# Patient Record
Sex: Female | Born: 1990 | Race: Black or African American | Hispanic: No | Marital: Single | Smoking: Current every day smoker
Health system: Southern US, Community
[De-identification: ages and names within clinical notes are randomized; demographics above are authoritative.]

## PROBLEM LIST (undated history)

## (undated) DIAGNOSIS — S92909A Unspecified fracture of unspecified foot, initial encounter for closed fracture: Secondary | ICD-10-CM

## (undated) DIAGNOSIS — E119 Type 2 diabetes mellitus without complications: Secondary | ICD-10-CM

## (undated) DIAGNOSIS — I10 Essential (primary) hypertension: Secondary | ICD-10-CM

---
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
## 2010-03-25 ENCOUNTER — Emergency Department (HOSPITAL_COMMUNITY): Admission: EM | Admit: 2010-03-25 | Discharge: 2010-03-25 | Payer: Self-pay | Admitting: Emergency Medicine

## 2010-08-23 LAB — RPR: RPR Ser Ql: NONREACTIVE

## 2010-08-23 LAB — WET PREP, GENITAL

## 2010-08-23 LAB — GC/CHLAMYDIA PROBE AMP, GENITAL: GC Probe Amp, Genital: NEGATIVE

## 2010-08-23 LAB — ABO/RH: ABO/RH(D): O POS

## 2010-08-23 LAB — CBC
HCT: 34.7 % — ABNORMAL LOW (ref 36.0–46.0)
Hemoglobin: 11.5 g/dL — ABNORMAL LOW (ref 12.0–15.0)
RBC: 4.23 MIL/uL (ref 3.87–5.11)
WBC: 9.3 10*3/uL (ref 4.0–10.5)

## 2010-08-23 LAB — SICKLE CELL SCREEN: Sickle Cell Screen: NEGATIVE

## 2010-08-23 LAB — HIV ANTIBODY (ROUTINE TESTING W REFLEX): HIV: NONREACTIVE

## 2010-08-23 LAB — RUBELLA SCREEN: Rubella: 76.8 IU/mL — ABNORMAL HIGH

## 2010-08-23 LAB — TYPE AND SCREEN

## 2010-08-26 LAB — CBC
HCT: 35.6 % — ABNORMAL LOW (ref 36.0–46.0)
HCT: 36.2 % (ref 36.0–46.0)
Hemoglobin: 12 g/dL (ref 12.0–15.0)
MCHC: 32.8 g/dL (ref 30.0–36.0)
MCHC: 33.2 g/dL (ref 30.0–36.0)
MCV: 83.2 fL (ref 78.0–100.0)
MCV: 83.2 fL (ref 78.0–100.0)
Platelets: 174 10*3/uL (ref 150–400)
RBC: 4.35 MIL/uL (ref 3.87–5.11)
RDW: 16.5 % — ABNORMAL HIGH (ref 11.5–15.5)
WBC: 11.3 10*3/uL — ABNORMAL HIGH (ref 4.0–10.5)

## 2010-08-31 LAB — CBC
HCT: 35.2 % — ABNORMAL LOW (ref 36.0–46.0)
Platelets: 225 10*3/uL (ref 150–400)
RDW: 15.8 % — ABNORMAL HIGH (ref 11.5–15.5)

## 2010-08-31 LAB — DIFFERENTIAL
Basophils Absolute: 0 10*3/uL (ref 0.0–0.1)
Lymphs Abs: 1.2 10*3/uL (ref 0.7–4.0)
Monocytes Absolute: 1.6 10*3/uL — ABNORMAL HIGH (ref 0.1–1.0)
Monocytes Relative: 17 % — ABNORMAL HIGH (ref 3–12)
Neutrophils Relative %: 69 % (ref 43–77)

## 2010-09-11 LAB — POCT PREGNANCY, URINE: Preg Test, Ur: POSITIVE

## 2011-03-18 LAB — URINE CULTURE: Colony Count: >=100000

## 2011-03-18 LAB — URINALYSIS, ROUTINE W REFLEX MICROSCOPIC
Ketones, ur: NEGATIVE
Nitrite: POSITIVE — AB
pH: 6

## 2011-03-18 LAB — URINE MICROSCOPIC-ADD ON

## 2011-03-18 LAB — POCT PREGNANCY, URINE: Preg Test, Ur: NEGATIVE

## 2014-04-08 ENCOUNTER — Encounter (HOSPITAL_COMMUNITY): Payer: Self-pay | Admitting: Emergency Medicine

## 2014-04-08 ENCOUNTER — Emergency Department (HOSPITAL_COMMUNITY): Payer: Medicaid Other

## 2014-04-08 ENCOUNTER — Emergency Department (HOSPITAL_COMMUNITY)
Admission: EM | Admit: 2014-04-08 | Discharge: 2014-04-08 | Disposition: A | Discharge status: Home / Self Care | Payer: Medicaid Other | Attending: Emergency Medicine | Admitting: Emergency Medicine

## 2014-04-08 DIAGNOSIS — X58XXXA Exposure to other specified factors, initial encounter: Secondary | ICD-10-CM | POA: Insufficient documentation

## 2014-04-08 DIAGNOSIS — I10 Essential (primary) hypertension: Secondary | ICD-10-CM | POA: Diagnosis not present

## 2014-04-08 DIAGNOSIS — Y9301 Activity, walking, marching and hiking: Secondary | ICD-10-CM | POA: Diagnosis not present

## 2014-04-08 DIAGNOSIS — Z8781 Personal history of (healed) traumatic fracture: Secondary | ICD-10-CM | POA: Insufficient documentation

## 2014-04-08 DIAGNOSIS — Y92513 Shop (commercial) as the place of occurrence of the external cause: Secondary | ICD-10-CM | POA: Diagnosis not present

## 2014-04-08 DIAGNOSIS — M25579 Pain in unspecified ankle and joints of unspecified foot: Secondary | ICD-10-CM

## 2014-04-08 DIAGNOSIS — Z720 Tobacco use: Secondary | ICD-10-CM | POA: Diagnosis not present

## 2014-04-08 DIAGNOSIS — S99911A Unspecified injury of right ankle, initial encounter: Secondary | ICD-10-CM | POA: Insufficient documentation

## 2014-04-08 HISTORY — DX: Essential (primary) hypertension: I10

## 2014-04-08 HISTORY — DX: Unspecified fracture of unspecified foot, initial encounter for closed fracture: S92.909A

## 2014-04-08 MED ORDER — HYDROCODONE-ACETAMINOPHEN 5-325 MG PO TABS
1.0000 | ORAL_TABLET | Freq: Once | ORAL | Status: AC
Start: 2014-04-08 — End: 2014-04-08
  Administered 2014-04-08: 1 via ORAL
  Filled 2014-04-08: qty 1

## 2014-04-08 MED ORDER — HYDROCODONE-ACETAMINOPHEN 5-325 MG PO TABS: 1.0000 | ORAL_TABLET | Freq: Four times a day (QID) | ORAL | Status: DC | PRN
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
## 2014-04-08 NOTE — Discharge Instructions (Signed)
Ankle Pain Ankle pain is a common symptom. The bones, cartilage, tendons, and muscles of the ankle joint perform a lot of work each day. The ankle joint holds your body weight and allows you to move around. Ankle pain can occur on either side or back of 1 or both ankles. Ankle pain may be sharp and burning or dull and aching. There may be tenderness, stiffness, redness, or warmth around the ankle. The pain occurs more often when a person walks or puts pressure on the ankle. CAUSES  There are many reasons ankle pain can develop. It is important to work with your caregiver to identify the cause since many conditions can impact the bones, cartilage, muscles, and tendons. Causes for ankle pain include:  Injury, including a break (fracture), sprain, or strain often due to a fall, sports, or a high-impact activity.  Swelling (inflammation) of a tendon (tendonitis).  Achilles tendon rupture.  Ankle instability after repeated sprains and strains.  Poor foot alignment.  Pressure on a nerve (tarsal tunnel syndrome).  Arthritis in the ankle or the lining of the ankle.  Crystal formation in the ankle (gout or pseudogout). DIAGNOSIS  A diagnosis is based on your medical history, your symptoms, results of your physical exam, and results of diagnostic tests. Diagnostic tests may include X-ray exams or a computerized magnetic scan (magnetic resonance imaging, MRI). TREATMENT  Treatment will depend on the cause of your ankle pain and may include:  Keeping pressure off the ankle and limiting activities.  Using crutches or other walking support (a cane or brace).  Using rest, ice, compression, and elevation.  Participating in physical therapy or home exercises.  Wearing shoe inserts or special shoes.  Losing weight.  Taking medications to reduce pain or swelling or receiving an injection.  Undergoing surgery. HOME CARE INSTRUCTIONS   Only take over-the-counter or prescription medicines for  pain, discomfort, or fever as directed by your caregiver.  Put ice on the injured area.  Put ice in a plastic bag.  Place a towel between your skin and the bag.  Leave the ice on for 15-20 minutes at a time, 03-04 times a day.  Keep your leg raised (elevated) when possible to lessen swelling.  Avoid activities that cause ankle pain.  Follow specific exercises as directed by your caregiver.  Record how often you have ankle pain, the location of the pain, and what it feels like. This information may be helpful to you and your caregiver.  Ask your caregiver about returning to work or sports and whether you should drive.  Follow up with your caregiver for further examination, therapy, or testing as directed. SEEK MEDICAL CARE IF:   Pain or swelling continues or worsens beyond 1 week.  You have an oral temperature above 102 F (38.9 C).  You are feeling unwell or have chills.  You are having an increasingly difficult time with walking.  You have loss of sensation or other new symptoms.  You have questions or concerns. MAKE SURE YOU:   Understand these instructions.  Will watch your condition.  Will get help right away if you are not doing well or get worse. Document Released: 11/11/2009 Document Revised: 08/16/2011 Document Reviewed: 11/11/2009 [facility] Patient Information 2015 [place], [place]. This information is not intended to replace advice given to you by your health care provider. Make sure you discuss any questions you have with your health care provider. wear the ACE wrap for comfort for the next several days

## 2014-07-22 ENCOUNTER — Emergency Department (HOSPITAL_COMMUNITY)
Admission: EM | Admit: 2014-07-22 | Discharge: 2014-07-23 | Disposition: A | Discharge status: Home / Self Care | Payer: Medicaid Other | Attending: Emergency Medicine | Admitting: Emergency Medicine

## 2014-07-22 ENCOUNTER — Encounter (HOSPITAL_COMMUNITY): Payer: Self-pay

## 2014-07-22 ENCOUNTER — Emergency Department (HOSPITAL_COMMUNITY): Payer: Medicaid Other

## 2014-07-22 DIAGNOSIS — Z720 Tobacco use: Secondary | ICD-10-CM | POA: Diagnosis not present

## 2014-07-22 DIAGNOSIS — I10 Essential (primary) hypertension: Secondary | ICD-10-CM | POA: Diagnosis not present

## 2014-07-22 DIAGNOSIS — M940 Chondrocostal junction syndrome [Tietze]: Secondary | ICD-10-CM | POA: Diagnosis not present

## 2014-07-22 DIAGNOSIS — J069 Acute upper respiratory infection, unspecified: Secondary | ICD-10-CM | POA: Insufficient documentation

## 2014-07-22 DIAGNOSIS — Z8781 Personal history of (healed) traumatic fracture: Secondary | ICD-10-CM | POA: Diagnosis not present

## 2014-07-22 DIAGNOSIS — R05 Cough: Secondary | ICD-10-CM | POA: Diagnosis present

## 2014-07-22 DIAGNOSIS — R000 Tachycardia, unspecified: Secondary | ICD-10-CM | POA: Diagnosis not present

## 2014-07-22 DIAGNOSIS — B9789 Other viral agents as the cause of diseases classified elsewhere: Secondary | ICD-10-CM

## 2014-07-22 NOTE — ED Notes (Signed)
Pt presents with c/o back pain in the middle of her back and says that her chest hurts when she coughs. Pt reports the cough started 2 days ago.

## 2014-07-22 NOTE — ED Provider Notes (Signed)
CSN: [id number]     Arrival date & time 07/22/14  2322 History  This chart was scribed for non-physician practitioner, Antony MaduraKelly Octa Uplinger, PA-C working with Tomasita CrumbleAdeleke Oni, MD by Freida Busmaniana Omoyeni, ED Scribe. This patient was seen in room WTR7/WTR7 and the patient's care was started at 11:37 PM.   Chief Complaint  Patient presents with  . Back Pain  . Cough    The history is provided by the patient. No language interpreter was used.    HPI Comments:  Wendy Waters is a 24 y.o. female who presents to the Emergency Department complaining of persistent dry cough and congestion for 3 days. She reports associated intermittent, sharp, stabbing chest and back pain and mild SOB that started this evening. Symptoms worsened when supine. No modifying factors noted. Patient reports that she did take a shot of liquor to try and alleviate symptoms, without relief. She denies fever, vomiting, syncope, and hemoptysis. She also denies recent hospitalization/surgeries, use of birth control pills, and recent sick contacts.   Past Medical History  Diagnosis Date  . Hypertension   . Fracture of foot     right   Past Surgical History  Procedure Laterality Date  . Cesarean section     No family history on file. History  Substance Use Topics  . Smoking status: Current Every Day Smoker -- 0.10 packs/day    Types: Cigarettes  . Smokeless tobacco: Not on file  . Alcohol Use: Yes     Comment: occ   OB History    No data available      Review of Systems  Constitutional: Negative for fever.  HENT: Positive for congestion.   Respiratory: Positive for cough and shortness of breath.   Cardiovascular: Positive for chest pain.  Musculoskeletal: Positive for back pain.  All other systems reviewed and are negative.   Allergies  Aleve  Home Medications   Prior to Admission medications   Medication Sig Start Date End Date Taking? Authorizing Provider  HYDROcodone-acetaminophen (NORCO/VICODIN) 5-325 MG per tablet  Take 1 tablet by mouth every 6 (six) hours as needed for moderate pain. 04/08/14   Arman FilterGail K Schulz, NP   BP 135/68 mmHg  Pulse 116  Temp(Src) 99.5 F (37.5 C) (Oral)  Resp 20  Ht 5\' 6"  (1.676 m)  Wt 150 lb (68.04 kg)  BMI 24.22 kg/m2  SpO2 98%  LMP 06/21/2014   Physical Exam  Constitutional: She is oriented to person, place, and time. She appears well-developed and well-nourished. No distress.  Nontoxic/nonseptic appearing  HENT:  Head: Normocephalic and atraumatic.  Right Ear: External ear and ear canal normal.  Left Ear: Tympanic membrane, external ear and ear canal normal.  Mouth/Throat: Uvula is midline, oropharynx is clear and moist and mucous membranes are normal.  Audible nasal congestion. Oropharynx clear and uvula midline. Patient tolerating secretions without difficulty. Right TM obscured by cerumen.  Eyes: Conjunctivae and EOM are normal. Pupils are equal, round, and reactive to light. No scleral icterus.  Neck: Normal range of motion.  Cardiovascular: Regular rhythm and intact distal pulses.  Tachycardia present.   Pulmonary/Chest: Effort normal. No respiratory distress. She has no wheezes. She has no rales.  Respirations even and unlabored. Lungs CTAB.  Musculoskeletal: Normal range of motion.  Neurological: She is alert and oriented to person, place, and time. She exhibits normal muscle tone. Coordination normal.  GCS 15. Patient moves extremities without ataxia.  Skin: Skin is warm and dry. No rash noted. She is not diaphoretic. No  erythema. No pallor.  Psychiatric: She has a normal mood and affect. Her behavior is normal.  Nursing note and vitals reviewed.   ED Course  Procedures   DIAGNOSTIC STUDIES:  Oxygen Saturation is 98% on RA, normal by my interpretation.    COORDINATION OF CARE:  11:41 PM Will order XR. Discussed treatment plan with pt at bedside and pt agreed to plan.  Labs Review Labs Reviewed  D-DIMER, QUANTITATIVE   Imaging Review Dg Chest 2  View  07/22/2014   CLINICAL DATA:  Nonproductive cough. Midchest pain and mid thoracic pain  EXAM: CHEST  2 VIEW  COMPARISON:  08/12/2009  FINDINGS: The heart size and mediastinal contours are within normal limits. Both lungs are clear. The visualized skeletal structures are unremarkable.  IMPRESSION: No acute findings   Electronically Signed   By: Ellery Plunk M.D.   On: 07/22/2014 23:54     EKG Interpretation None      MDM   Final diagnoses:  Viral URI with cough  Costochondritis    Patient with CXR negative for acute infiltrate. Patients symptoms are consistent with URI, likely viral etiology. Pulmonary embolus considered given tachycardia on arrival; this is unlikely given associated URI symptoms, low risk for DVT/PE, ability to ambulate in ED without hypoxia, and negative D dimer on work up. Discussed that antibiotics are not indicated for viral infections. Patient will be discharged with symptomatic treatment. Return precautions given. Patient agreeable to plan with no unaddressed concerns.  I personally performed the services described in this documentation, which was scribed in my presence. The recorded information has been reviewed and is accurate.   Filed Vitals:   07/22/14 2330 07/23/14 0050  BP: 135/68 136/74  Pulse: 116 95  Temp: 99.5 F (37.5 C) 99 F (37.2 C)  TempSrc: Oral Oral  Resp: 20 20  Height:  (1.676 m)   Weight: 150 lb (68.04 kg)   SpO2: 98% 97%      Antony Madura, PA-C 07/23/14 5409  Tomasita Crumble, MD 07/23/14 1545

## 2014-07-23 LAB — D-DIMER, QUANTITATIVE: D-Dimer, Quant: 0.46 ug/mL-FEU (ref 0.00–0.48)

## 2014-07-23 MED ORDER — BENZONATATE 100 MG PO CAPS: 100.0000 mg | ORAL_CAPSULE | Freq: Three times a day (TID) | ORAL | Status: DC

## 2014-07-23 MED ORDER — PREDNISONE 20 MG PO TABS: 40.0000 mg | ORAL_TABLET | Freq: Every day | ORAL | Status: DC

## 2014-07-23 MED ORDER — SALINE SPRAY 0.65 % NA SOLN: 1.0000 | NASAL | Status: DC | PRN

## 2014-07-23 MED ORDER — ALBUTEROL SULFATE HFA 108 (90 BASE) MCG/ACT IN AERS
2.0000 | INHALATION_SPRAY | Freq: Once | RESPIRATORY_TRACT | Status: AC
  Administered 2014-07-23: 2 via RESPIRATORY_TRACT
  Filled 2014-07-23: qty 6.7

## 2014-07-23 NOTE — Discharge Instructions (Signed)
Take prednisone as prescribed. You may also use an albuterol inhaler, 2 puffs every 4 hours, as needed for shortness of breath and cough. Take Tessalon as prescribed for cough. You may also use Ocean nasal spray for congestion. Follow-up with a primary care doctor in 1 week for recheck of symptoms. Return to the emergency department as needed if symptoms worsen.  Costochondritis Costochondritis, sometimes called Tietze syndrome, is a swelling and irritation (inflammation) of the tissue (cartilage) that connects your ribs with your breastbone (sternum). It causes pain in the chest and rib area. Costochondritis usually goes away on its own over time. It can take up to 6 weeks or longer to get better, especially if you are unable to limit your activities. CAUSES  Some cases of costochondritis have no known cause. Possible causes include:  Injury (trauma).  Exercise or activity such as lifting.  Severe coughing. SIGNS AND SYMPTOMS  Pain and tenderness in the chest and rib area.  Pain that gets worse when coughing or taking deep breaths.  Pain that gets worse with specific movements. DIAGNOSIS  Your health care provider will do a physical exam and ask about your symptoms. Chest X-rays or other tests may be done to rule out other problems. TREATMENT  Costochondritis usually goes away on its own over time. Your health care provider may prescribe medicine to help relieve pain. HOME CARE INSTRUCTIONS   Avoid exhausting physical activity. Try not to strain your ribs during normal activity. This would include any activities using chest, abdominal, and side muscles, especially if heavy weights are used.  Apply ice to the affected area for the first 2 days after the pain begins.  Put ice in a plastic bag.  Place a towel between your skin and the bag.  Leave the ice on for 20 minutes, 2-3 times a day.  Only take over-the-counter or prescription medicines as directed by your health care  provider. SEEK MEDICAL CARE IF:  You have redness or swelling at the rib joints. These are signs of infection.  Your pain does not go away despite rest or medicine. SEEK IMMEDIATE MEDICAL CARE IF:   Your pain increases or you are very uncomfortable.  You have shortness of breath or difficulty breathing.  You cough up blood.  You have worse chest pains, sweating, or vomiting.  You have a fever or persistent symptoms for more than 2-3 days.  You have a fever and your symptoms suddenly get worse. MAKE SURE YOU:   Understand these instructions.  Will watch your condition.  Will get help right away if you are not doing well or get worse. Document Released: 03/03/2005 Document Revised: 03/14/2013 Document Reviewed: 12/26/2012 [facility] Patient Information 2015 [place], [place]. This information is not intended to replace advice given to you by your health care provider. Make sure you discuss any questions you have with your health care provider. Upper Respiratory Infection, Adult An upper respiratory infection (URI) is also sometimes known as the common cold. The upper respiratory tract includes the nose, sinuses, throat, trachea, and bronchi. Bronchi are the airways leading to the lungs. Most people improve within 1 week, but symptoms can last up to 2 weeks. A residual cough may last even longer.  CAUSES Many different viruses can infect the tissues lining the upper respiratory tract. The tissues become irritated and inflamed and often become very moist. Mucus production is also common. A cold is contagious. You can easily spread the virus to others by oral contact. This includes kissing,  sharing a glass, coughing, or sneezing. Touching your mouth or nose and then touching a surface, which is then touched by another person, can also spread the virus. SYMPTOMS  Symptoms typically develop 1 to 3 days after you come in contact with a cold virus. Symptoms vary from person to person. They may  include:  Runny nose.  Sneezing.  Nasal congestion.  Sinus irritation.  Sore throat.  Loss of voice (laryngitis).  Cough.  Fatigue.  Muscle aches.  Loss of appetite.  Headache.  Low-grade fever. DIAGNOSIS  You might diagnose your own cold based on familiar symptoms, since most people get a cold 2 to 3 times a year. Your caregiver can confirm this based on your exam. Most importantly, your caregiver can check that your symptoms are not due to another disease such as strep throat, sinusitis, pneumonia, asthma, or epiglottitis. Blood tests, throat tests, and X-rays are not necessary to diagnose a common cold, but they may sometimes be helpful in excluding other more serious diseases. Your caregiver will decide if any further tests are required. RISKS AND COMPLICATIONS  You may be at risk for a more severe case of the common cold if you smoke cigarettes, have chronic heart disease (such as heart failure) or lung disease (such as asthma), or if you have a weakened immune system. The very young and very old are also at risk for more serious infections. Bacterial sinusitis, middle ear infections, and bacterial pneumonia can complicate the common cold. The common cold can worsen asthma and chronic obstructive pulmonary disease (COPD). Sometimes, these complications can require emergency medical care and may be life-threatening. PREVENTION  The best way to protect against getting a cold is to practice good hygiene. Avoid oral or hand contact with people with cold symptoms. Wash your hands often if contact occurs. There is no clear evidence that vitamin C, vitamin E, echinacea, or exercise reduces the chance of developing a cold. However, it is always recommended to get plenty of rest and practice good nutrition. TREATMENT  Treatment is directed at relieving symptoms. There is no cure. Antibiotics are not effective, because the infection is caused by a virus, not by bacteria. Treatment may  include:  Increased fluid intake. Sports drinks offer valuable electrolytes, sugars, and fluids.  Breathing heated mist or steam (vaporizer or shower).  Eating chicken soup or other clear broths, and maintaining good nutrition.  Getting plenty of rest.  Using gargles or lozenges for comfort.  Controlling fevers with ibuprofen or acetaminophen as directed by your caregiver.  Increasing usage of your inhaler if you have asthma. Zinc gel and zinc lozenges, taken in the first 24 hours of the common cold, can shorten the duration and lessen the severity of symptoms. Pain medicines may help with fever, muscle aches, and throat pain. A variety of non-prescription medicines are available to treat congestion and runny nose. Your caregiver can make recommendations and may suggest nasal or lung inhalers for other symptoms.  HOME CARE INSTRUCTIONS   Only take over-the-counter or prescription medicines for pain, discomfort, or fever as directed by your caregiver.  Use a warm mist humidifier or inhale steam from a shower to increase air moisture. This may keep secretions moist and make it easier to breathe.  Drink enough water and fluids to keep your urine clear or pale yellow.  Rest as needed.  Return to work when your temperature has returned to normal or as your caregiver advises. You may need to stay home longer to avoid  infecting others. You can also use a face mask and careful hand washing to prevent spread of the virus. SEEK MEDICAL CARE IF:   After the first few days, you feel you are getting worse rather than better.  You need your caregiver's advice about medicines to control symptoms.  You develop chills, worsening shortness of breath, or brown or red sputum. These may be signs of pneumonia.  You develop yellow or brown nasal discharge or pain in the face, especially when you bend forward. These may be signs of sinusitis.  You develop a fever, swollen neck glands, pain with  swallowing, or white areas in the back of your throat. These may be signs of strep throat. SEEK IMMEDIATE MEDICAL CARE IF:   You have a fever.  You develop severe or persistent headache, ear pain, sinus pain, or chest pain.  You develop wheezing, a prolonged cough, cough up blood, or have a change in your usual mucus (if you have chronic lung disease).  You develop sore muscles or a stiff neck. Document Released: 11/17/2000 Document Revised: 08/16/2011 Document Reviewed: 08/29/2013 [facility] Patient Information 2015 [place], [place]. This information is not intended to replace advice given to you by your health care provider. Make sure you discuss any questions you have with your health care provider. Cough, Adult  A cough is a reflex that helps clear your throat and airways. It can help heal the body or may be a reaction to an irritated airway. A cough may only last 2 or 3 weeks (acute) or may last more than 8 weeks (chronic).  CAUSES Acute cough:  Viral or bacterial infections. Chronic cough:  Infections.  Allergies.  Asthma.  Post-nasal drip.  Smoking.  Heartburn or acid reflux.  Some medicines.  Chronic lung problems (COPD).  Cancer. SYMPTOMS   Cough.  Fever.  Chest pain.  Increased breathing rate.  High-pitched whistling sound when breathing (wheezing).  Colored mucus that you cough up (sputum). TREATMENT   A bacterial cough may be treated with antibiotic medicine.  A viral cough must run its course and will not respond to antibiotics.  Your caregiver may recommend other treatments if you have a chronic cough. HOME CARE INSTRUCTIONS   Only take over-the-counter or prescription medicines for pain, discomfort, or fever as directed by your caregiver. Use cough suppressants only as directed by your caregiver.  Use a cold steam vaporizer or humidifier in your bedroom or home to help loosen secretions.  Sleep in a semi-upright position if your cough is  worse at night.  Rest as needed.  Stop smoking if you smoke. SEEK IMMEDIATE MEDICAL CARE IF:   You have pus in your sputum.  Your cough starts to worsen.  You cannot control your cough with suppressants and are losing sleep.  You begin coughing up blood.  You have difficulty breathing.  You develop pain which is getting worse or is uncontrolled with medicine.  You have a fever. MAKE SURE YOU:   Understand these instructions.  Will watch your condition.  Will get help right away if you are not doing well or get worse. Document Released: 11/20/2010 Document Revised: 08/16/2011 Document Reviewed: 11/20/2010 [facility] Patient Information 2015 [place], [place]. This information is not intended to replace advice given to you by your health care provider. Make sure you discuss any questions you have with your health care provider.   Emergency Department Resource Guide 1) Find a Doctor and Pay Out of Pocket Although you won't have to find out  who is covered by your insurance plan, it is a good idea to ask around and get recommendations. You will then need to call the office and see if the doctor you have chosen will accept you as a new patient and what types of options they offer for patients who are self-pay. Some doctors offer discounts or will set up payment plans for their patients who do not have insurance, but you will need to ask so you aren't surprised when you get to your appointment.  2) Contact Your Local Health Department Not all health departments have doctors that can see patients for sick visits, but many do, so it is worth a call to see if yours does. If you don't know where your local health department is, you can check in your phone book. The CDC also has a tool to help you locate your state's health department, and many state websites also have listings of all of their local health departments.  3) Find a Walk-in Clinic If your illness is not likely to be very severe  or complicated, you may want to try a walk in clinic. These are popping up all over the country in pharmacies, drugstores, and shopping centers. They're usually staffed by nurse practitioners or physician assistants that have been trained to treat common illnesses and complaints. They're usually fairly quick and inexpensive. However, if you have serious medical issues or chronic medical problems, these are probably not your best option.  No Primary Care Doctor: - Call Health Connect at  [phone] - they can help you locate a primary care doctor that  accepts your insurance, provides certain services, etc. - Physician Referral Service- [phone]  Chronic Pain Problems: Organization         Address  Phone   Notes  [facility]  [phone] Patients need to be referred by their primary care doctor.   Medication Assistance: Organization         Address  Phone   Notes  [facility] [address] [place] [place], [place] [address] [phone] --Must be a resident of [facility] -- Must have NO insurance coverage whatsoever (no Medicaid/ Medicare, etc.) -- The pt. MUST have a primary care doctor that directs their care regularly and follows them in the community   [facility]  [phone]   [employer]  [phone]    Agencies that provide inexpensive medical care: Organization         Address  Phone   Notes  [facility]  [phone]   [facility]    [phone]   [facility] [address] [place], [place] [address] [phone]   [facility] [address] [place], [place] [phone]   Planned Parenthood    [phone]   [facility]    [phone]   Community Health and [facility]  [address], [place] Phone:  [phone], Fax:  [phone] Hours of Operation:  9 am - 6 pm, M-F.  Also accepts  Medicaid/Medicare and self-pay.  [facility]  [address], [place] Phone: [phone], Fax: [phone]. Hours of Operation:  8:30 am - 5:30 pm, M-F.  Also accepts Medicaid and self-pay.  [facility] [place] [address] [employer] Phone: [phone]   [facility] [address] [place], [place] (  [phone], Ext. [phone] Mondays & Thursdays: 7-9 AM.  First 15 patients are seen on a first come, first serve basis.    Medicaid-accepting [facility] Providers:  Organization         Address  Phone   Notes  [facility] [address], [place] [phone] Also accepts self-pay patients.  [facility] [address], [place]  [phone]   [facility] [address], [place] [phone]   [facility] [address] [place] [address], [place] [phone]) [phone]   Renaye RakersVeita Bland [address], [place]   [phone]) [phone] Only accepts [place] Access [place] patients after they have their name applied to their card.   Self-Pay (no insurance) in [facility] [place]:  Organization         Address  Phone   Notes  Sickle Cell Patients, [facility] [address], [place] [phone]   [facility] [address], [place] [phone](336) [phone]   [facility]  [address], [place] [phone]   [facility]/Dr. Osei-Bonsu  [address] [place] [address], [place] or [phone], [place] [phone] Phone number for both [place] and [place] locations is the same.  [facility] [address], [place] [phone]   [facility] [address] [place] [address], [place] or [address] [phone]   [facility] [address], [place] [phone])  [phone], phone; [phone], fax Sees patients 1st and 3rd Saturday of every month.  Must not qualify for public or private insurance (i.e. Medicaid, Medicare, [place] Health Choice, Veterans' Benefits)  Household income should be no more than 200% of the poverty level The clinic cannot treat you if you are pregnant or think you are pregnant  Sexually transmitted diseases are not treated at the clinic.    Dental Care: Organization         Address  Phone  Notes  [facility] [address], [place] [phone] Accepts children up to age 24 who are enrolled in [place] or [place] Health Choice; pregnant women with a Medicaid card; and children who have applied for Medicaid or [place] Health Choice, but were declined, whose parents can pay a reduced fee at time of service.  [facility] [place]  [address], [place] [phone] Accepts children up to age 24 who are enrolled in [place] or [place] Health Choice; pregnant women with a Medicaid card; and children who have applied for Medicaid or [place] Health Choice, but were declined, whose parents can pay a reduced fee at time of service.  [facility]  [address], [place] [phone]) [phone] Patients are seen by appointment only. Walk-ins are not accepted. [facility] will see patients 24 years of age and older. Monday - Tuesday (8am-5pm) Most Wednesdays (8:30-5pm) $30 per visit, cash only  [facility]  [address], [facility] [phone]) [phone] Patients are seen by appointment only. Walk-ins are not accepted. [facility] will see patients 24 years of age and older. One Wednesday Evening (Monthly: Volunteer Based).  $30 per visit, cash only  [employer] [facility] [facility]  [phone]  for adults; Children under age 63, call Graduate Pediatric Dentistry at [phone]. Children aged  64-14, please call [phone] to request a pediatric application.  Dental services are provided in all areas of dental care including fillings, crowns and bridges, complete and partial dentures, implants, gum treatment, root canals, and extractions. Preventive care is also provided. Treatment is provided to both adults and children. Patients are selected via a lottery and there is often a waiting list.   [facility] [address] [place]  [phone] [email or web address]   [facility] [address] [place], [place] [phone], Ext. [phone] Second and Fourth Thursday of each month, opens at 6:30 AM; Clinic ends at 9 AM.  Patients are seen on a first-come first-served basis, and a limited number are seen during each clinic.   [facility]  [address] [place], [place] [phone]   Eligibility Requirements You must have lived in [place], [place], or [place] counties for at least the last three months.   You cannot be eligible for state or federal sponsored [place], including [employer], [place], or [employer].   You generally cannot be eligible for healthcare insurance through your employer.    How to apply: Eligibility screenings are held every Tuesday and Wednesday afternoon from 1:00 pm until 4:00 pm. You do not need an appointment for the interview!  [facility] [address] [place], [place] [phone]   [facility] Health Department  [phone]   [facility] Health Department  [phone]   [facility] Health Department  [phone]    Behavioral Health Resources in the Community: Intensive Outpatient Programs Organization         Address  Phone  Notes  [facility] [address] [place], [place] [phone]   [facility] [address] [place], [place] [phone]   [facility] [address]  [place], [place]  [phone]   [facility] [address],  [place], [place] [phone] or [phone]   Substance Abuse Resources Organization         Address  Phone  Notes  [facility]  [phone]   [facility]  [phone]   [facility] [place]  [phone]   Floydene Flock  [phone]   Residential & Outpatient Substance Abuse Program  [phone]   Psychological Services Organization         Address  Phone  Notes  [facility]  [phone]   [facility]  [phone]   [facility] [address], [place] [phone] or [phone]    Mobile Crisis Teams Organization         Address  Phone  Notes  [facility], Mobile Crisis Care Unit  [phone]   [facility]  [address] [place], [place] [phone]   Doristine Locks [address] [place] [phone]    Self-Help/Support Groups Organization         Address  Phone             Notes  [facility] [place] - variety of support groups  [phone]- I7437963 Call for more information  Narcotics Anonymous (NA), [facility] [address], [employer] [place]  2 meetings at this location   Chief Executive Officer  Notes  [facility] [address]    [place]  [phone]   [facility]  [address], [place] [address], [place], [place] [phone]   [facility] [address] [place], [place] [phone] Admissions: 8am-3pm M-F  [facility] [address]    [place], [place] [phone]   [facility] [address] [place], [place], [place] [phone]   [facility] [address],  [place], [place] [phone]   [facility] - Intensive Outpatient [address], [place], [place] [phone]   [facility]  (Addiction [facility] [address]  [place], [place] [phone] or [phone]   [facility] ([facility]) [address], [place], [place] [phone] Accepts Medicaid  [facility] [address]  [place] [phone] Substance Abuse/Addiction Treatment   [facility] Organization         Address  Phone  Notes  [facility]  [phone]   Angie Fava, PhD [address] [place], [place]   [phone] or [phone]   [facility] Behavioral   [address] [place], [place] [phone]   [facility] [address], [place], [place] [phone] Insurance/Medicaid/sponsorship through [facility] and Families [address]                                    [place], [place] [phone] Therapy/tele-psych/case  [facility] [address], [place] [id number]    Dr. Lolly Mustache  [phone]   [facility]  [employer] [facility] Dept. 1) [address], [place] 2) [address] [place] 3)  [address], [place] [phone]  [phone]   [place] Child Abuse Hotline [phone] or [phone] (After Hours)

## 2014-07-23 NOTE — ED Notes (Signed)
Ambulated patient in the hallway. Pt's O2 sat's were 97%-98%, HR 118.

## 2014-10-15 ENCOUNTER — Emergency Department (HOSPITAL_COMMUNITY)
Admission: EM | Admit: 2014-10-15 | Discharge: 2014-10-15 | Disposition: A | Discharge status: Home / Self Care | Payer: Medicaid Other | Attending: Emergency Medicine | Admitting: Emergency Medicine

## 2014-10-15 ENCOUNTER — Encounter (HOSPITAL_COMMUNITY): Payer: Self-pay

## 2014-10-15 DIAGNOSIS — Z9889 Other specified postprocedural states: Secondary | ICD-10-CM | POA: Diagnosis not present

## 2014-10-15 DIAGNOSIS — N760 Acute vaginitis: Secondary | ICD-10-CM | POA: Diagnosis not present

## 2014-10-15 DIAGNOSIS — Z8781 Personal history of (healed) traumatic fracture: Secondary | ICD-10-CM | POA: Diagnosis not present

## 2014-10-15 DIAGNOSIS — N898 Other specified noninflammatory disorders of vagina: Secondary | ICD-10-CM | POA: Diagnosis present

## 2014-10-15 DIAGNOSIS — Z3202 Encounter for pregnancy test, result negative: Secondary | ICD-10-CM | POA: Diagnosis not present

## 2014-10-15 DIAGNOSIS — Z720 Tobacco use: Secondary | ICD-10-CM | POA: Diagnosis not present

## 2014-10-15 DIAGNOSIS — B9689 Other specified bacterial agents as the cause of diseases classified elsewhere: Secondary | ICD-10-CM

## 2014-10-15 LAB — URINE MICROSCOPIC-ADD ON

## 2014-10-15 LAB — WET PREP, GENITAL
Trich, Wet Prep: NONE SEEN
Yeast Wet Prep HPF POC: NONE SEEN

## 2014-10-15 LAB — URINALYSIS, ROUTINE W REFLEX MICROSCOPIC
Bilirubin Urine: NEGATIVE
GLUCOSE, UA: NEGATIVE mg/dL
KETONES UR: NEGATIVE mg/dL
Nitrite: NEGATIVE
PH: 5.5 (ref 5.0–8.0)
Protein, ur: 100 mg/dL — AB
Specific Gravity, Urine: 1.021 (ref 1.005–1.030)
Urobilinogen, UA: 1 mg/dL (ref 0.0–1.0)

## 2014-10-15 LAB — PREGNANCY, URINE: Preg Test, Ur: NEGATIVE

## 2014-10-15 MED ORDER — METRONIDAZOLE 500 MG PO TABS: 500.0000 mg | ORAL_TABLET | Freq: Two times a day (BID) | ORAL | Status: DC

## 2014-10-15 NOTE — Discharge Instructions (Signed)
Bacterial Vaginosis Bacterial vaginosis is a vaginal infection that occurs when the normal balance of bacteria in the vagina is disrupted. It results from an overgrowth of certain bacteria. This is the most common vaginal infection in women of childbearing age. Treatment is important to prevent complications, especially in pregnant women, as it can cause a premature delivery. CAUSES  Bacterial vaginosis is caused by an increase in harmful bacteria that are normally present in smaller amounts in the vagina. Several different kinds of bacteria can cause bacterial vaginosis. However, the reason that the condition develops is not fully understood. RISK FACTORS Certain activities or behaviors can put you at an increased risk of developing bacterial vaginosis, including:  Having a new sex partner or multiple sex partners.  Douching.  Using an intrauterine device (IUD) for contraception. Women do not get bacterial vaginosis from toilet seats, bedding, swimming pools, or contact with objects around them. SIGNS AND SYMPTOMS  Some women with bacterial vaginosis have no signs or symptoms. Common symptoms include:  Grey vaginal discharge.  A fishlike odor with discharge, especially after sexual intercourse.  Itching or burning of the vagina and vulva.  Burning or pain with urination. DIAGNOSIS  Your health care provider will take a medical history and examine the vagina for signs of bacterial vaginosis. A sample of vaginal fluid may be taken. Your health care provider will look at this sample under a microscope to check for bacteria and abnormal cells. A vaginal pH test may also be done.  TREATMENT  Bacterial vaginosis may be treated with antibiotic medicines. These may be given in the form of a pill or a vaginal cream. A second round of antibiotics may be prescribed if the condition comes back after treatment.  HOME CARE INSTRUCTIONS   Only take over-the-counter or prescription medicines as  directed by your health care provider.  If antibiotic medicine was prescribed, take it as directed. Make sure you finish it even if you start to feel better.  Do not have sex until treatment is completed.  Tell all sexual partners that you have a vaginal infection. They should see their health care provider and be treated if they have problems, such as a mild rash or itching.  Practice safe sex by using condoms and only having one sex partner. SEEK MEDICAL CARE IF:   Your symptoms are not improving after 3 days of treatment.  You have increased discharge or pain.  You have a fever. MAKE SURE YOU:   Understand these instructions.  Will watch your condition.  Will get help right away if you are not doing well or get worse. FOR MORE INFORMATION  Centers for Disease Control and Prevention, Division of STD Prevention: www.cdc.gov/std American Sexual Health Association (ASHA): www.ashastd.org  Document Released: 05/24/2005 Document Revised: 03/14/2013 Document Reviewed: 01/03/2013 ExitCare Patient Information 2015 [employer]. This information is not intended to replace advice given to you by your health care provider. Make sure you discuss any questions you have with your health care provider.  

## 2014-10-15 NOTE — ED Notes (Signed)
Pt complains of vaginal itching and discharge since Sunday, she used a different soap over the weekend

## 2014-10-15 NOTE — ED Provider Notes (Signed)
CSN: [id number]     Arrival date & time 10/15/14  2051 History   First MD Initiated Contact with Patient 10/15/14 2214     Chief Complaint  Patient presents with  . Vaginal Discharge     (Consider location/radiation/quality/duration/timing/severity/associated sxs/prior Treatment) HPI Comments: Patient here complaining of vaginal itching and discharge 2 days. Patient also notes a foul smell as well as well 2. Denies any dysuria or hematuria. Did use a new soap this weekend and S1 symptoms started. No flank pain. Symptoms persistent and nothing makes them better. Denies any abdominal pain. No treatment use prior to arrival.  Patient is a 24 y.o. female presenting with vaginal discharge. The history is provided by the patient.  Vaginal Discharge   Past Medical History  Diagnosis Date  . Hypertension   . Fracture of foot     right   Past Surgical History  Procedure Laterality Date  . Cesarean section     History reviewed. No pertinent family history. History  Substance Use Topics  . Smoking status: Current Every Day Smoker -- 0.10 packs/day    Types: Cigarettes  . Smokeless tobacco: Not on file  . Alcohol Use: Yes     Comment: occ   OB History    No data available     Review of Systems  Genitourinary: Positive for vaginal discharge.  All other systems reviewed and are negative.     Allergies  Aleve  Home Medications   Prior to Admission medications   Medication Sig Start Date End Date Taking? Authorizing Provider  benzonatate (TESSALON) 100 MG capsule Take 1 capsule (100 mg total) by mouth every 8 (eight) hours. Patient not taking: Reported on 10/15/2014 07/23/14   Antony MaduraKelly Humes, PA-C  HYDROcodone-acetaminophen (NORCO/VICODIN) 5-325 MG per tablet Take 1 tablet by mouth every 6 (six) hours as needed for moderate pain. Patient not taking: Reported on 10/15/2014 04/08/14   Earley FavorGail Schulz, NP  predniSONE (DELTASONE) 20 MG tablet Take 2 tablets (40 mg total) by mouth  daily. Patient not taking: Reported on 10/15/2014 07/23/14   Antony MaduraKelly Humes, PA-C  sodium chloride (OCEAN) 0.65 % SOLN nasal spray Place 1 spray into both nostrils as needed for congestion. Patient not taking: Reported on 10/15/2014 07/23/14   Antony MaduraKelly Humes, PA-C   BP 136/75 mmHg  Pulse 85  Temp(Src) 98.3 F (36.8 C) (Oral)  Resp 16  SpO2 97%  LMP 08/15/2014 Physical Exam  Constitutional: She is oriented to person, place, and time. She appears well-developed and well-nourished.  Non-toxic appearance. No distress.  HENT:  Head: Normocephalic and atraumatic.  Eyes: Conjunctivae, EOM and lids are normal. Pupils are equal, round, and reactive to light.  Neck: Normal range of motion. Neck supple. No tracheal deviation present. No thyroid mass present.  Cardiovascular: Normal rate, regular rhythm and normal heart sounds.  Exam reveals no gallop.   No murmur heard. Pulmonary/Chest: Effort normal and breath sounds normal. No stridor. No respiratory distress. She has no decreased breath sounds. She has no wheezes. She has no rhonchi. She has no rales.  Abdominal: Soft. Normal appearance and bowel sounds are normal. She exhibits no distension. There is no tenderness. There is no rebound and no CVA tenderness.  Genitourinary: Cervix exhibits discharge. Vaginal discharge found.  Musculoskeletal: Normal range of motion. She exhibits no edema or tenderness.  Neurological: She is alert and oriented to person, place, and time. She has normal strength. No cranial nerve deficit or sensory deficit. GCS eye subscore is 4. GCS  verbal subscore is 5. GCS motor subscore is 6.  Skin: Skin is warm and dry. No abrasion and no rash noted.  Psychiatric: She has a normal mood and affect. Her speech is normal and behavior is normal.  Nursing note and vitals reviewed.   ED Course  Procedures (including critical care time) Labs Review Labs Reviewed  URINALYSIS, ROUTINE W REFLEX MICROSCOPIC - Abnormal; Notable for the  following:    Color, Urine AMBER (*)    APPearance CLOUDY (*)    Hgb urine dipstick TRACE (*)    Protein, ur 100 (*)    Leukocytes, UA TRACE (*)    All other components within normal limits  URINE MICROSCOPIC-ADD ON - Abnormal; Notable for the following:    Squamous Epithelial / LPF MANY (*)    Bacteria, UA FEW (*)    Casts GRANULAR CAST (*)    All other components within normal limits  WET PREP, GENITAL  PREGNANCY, URINE  GC/CHLAMYDIA PROBE AMP ([place])    Imaging Review No results found.   EKG Interpretation None      MDM   Final diagnoses:  None    Pt to be tx for bv    Lorre NickAnthony Oneisha Ammons, MD 10/15/14 2252

## 2014-10-15 NOTE — ED Notes (Signed)
Pt states started using a different soap, now having vaginal itching and odor.

## 2014-10-16 LAB — GC/CHLAMYDIA PROBE AMP (~~LOC~~) NOT AT ARMC
CHLAMYDIA, DNA PROBE: NEGATIVE
NEISSERIA GONORRHEA: NEGATIVE

## 2014-11-18 ENCOUNTER — Encounter (HOSPITAL_COMMUNITY): Payer: Self-pay | Admitting: Emergency Medicine

## 2014-11-18 ENCOUNTER — Emergency Department (HOSPITAL_COMMUNITY): Payer: Medicaid Other

## 2014-11-18 ENCOUNTER — Emergency Department (HOSPITAL_COMMUNITY)
Admission: EM | Admit: 2014-11-18 | Discharge: 2014-11-18 | Disposition: A | Discharge status: Home / Self Care | Payer: Medicaid Other | Attending: Emergency Medicine | Admitting: Emergency Medicine

## 2014-11-18 DIAGNOSIS — R079 Chest pain, unspecified: Secondary | ICD-10-CM | POA: Insufficient documentation

## 2014-11-18 DIAGNOSIS — I10 Essential (primary) hypertension: Secondary | ICD-10-CM | POA: Diagnosis not present

## 2014-11-18 DIAGNOSIS — Z3202 Encounter for pregnancy test, result negative: Secondary | ICD-10-CM | POA: Diagnosis not present

## 2014-11-18 DIAGNOSIS — Z720 Tobacco use: Secondary | ICD-10-CM | POA: Insufficient documentation

## 2014-11-18 DIAGNOSIS — R0981 Nasal congestion: Secondary | ICD-10-CM | POA: Diagnosis present

## 2014-11-18 DIAGNOSIS — R000 Tachycardia, unspecified: Secondary | ICD-10-CM | POA: Insufficient documentation

## 2014-11-18 DIAGNOSIS — J069 Acute upper respiratory infection, unspecified: Secondary | ICD-10-CM | POA: Diagnosis not present

## 2014-11-18 LAB — URINALYSIS, ROUTINE W REFLEX MICROSCOPIC
BILIRUBIN URINE: NEGATIVE
Glucose, UA: NEGATIVE mg/dL
Ketones, ur: NEGATIVE mg/dL
NITRITE: NEGATIVE
PROTEIN: 100 mg/dL — AB
Specific Gravity, Urine: 1.02 (ref 1.005–1.030)
UROBILINOGEN UA: 1 mg/dL (ref 0.0–1.0)
pH: 6 (ref 5.0–8.0)

## 2014-11-18 LAB — CBC WITH DIFFERENTIAL/PLATELET
BASOS ABS: 0 10*3/uL (ref 0.0–0.1)
Basophils Relative: 1 % (ref 0–1)
EOS ABS: 0.3 10*3/uL (ref 0.0–0.7)
EOS PCT: 3 % (ref 0–5)
HEMATOCRIT: 45 % (ref 36.0–46.0)
Hemoglobin: 14.9 g/dL (ref 12.0–15.0)
LYMPHS PCT: 34 % (ref 12–46)
Lymphs Abs: 2.9 10*3/uL (ref 0.7–4.0)
MCH: 27.2 pg (ref 26.0–34.0)
MCHC: 33.1 g/dL (ref 30.0–36.0)
MCV: 82.3 fL (ref 78.0–100.0)
MONO ABS: 1.5 10*3/uL — AB (ref 0.1–1.0)
Monocytes Relative: 18 % — ABNORMAL HIGH (ref 3–12)
Neutro Abs: 3.7 10*3/uL (ref 1.7–7.7)
Neutrophils Relative %: 44 % (ref 43–77)
PLATELETS: 344 10*3/uL (ref 150–400)
RBC: 5.47 MIL/uL — ABNORMAL HIGH (ref 3.87–5.11)
RDW: 15.3 % (ref 11.5–15.5)
WBC: 8.4 10*3/uL (ref 4.0–10.5)

## 2014-11-18 LAB — COMPREHENSIVE METABOLIC PANEL
ALBUMIN: 4.1 g/dL (ref 3.5–5.0)
ALT: 29 U/L (ref 14–54)
ANION GAP: 8 (ref 5–15)
AST: 28 U/L (ref 15–41)
Alkaline Phosphatase: 95 U/L (ref 38–126)
BILIRUBIN TOTAL: 0.3 mg/dL (ref 0.3–1.2)
BUN: 15 mg/dL (ref 6–20)
CHLORIDE: 106 mmol/L (ref 101–111)
CO2: 29 mmol/L (ref 22–32)
CREATININE: 1.27 mg/dL — AB (ref 0.44–1.00)
Calcium: 9.7 mg/dL (ref 8.9–10.3)
GFR, EST NON AFRICAN AMERICAN: 59 mL/min — AB (ref >60)
GLUCOSE: 138 mg/dL — AB (ref 65–99)
Potassium: 4 mmol/L (ref 3.5–5.1)
Sodium: 143 mmol/L (ref 135–145)
Total Protein: 8.4 g/dL — ABNORMAL HIGH (ref 6.5–8.1)

## 2014-11-18 LAB — URINE MICROSCOPIC-ADD ON

## 2014-11-18 LAB — PREGNANCY, URINE: PREG TEST UR: NEGATIVE

## 2014-11-18 NOTE — Discharge Instructions (Signed)
Upper Respiratory Infection, Adult An upper respiratory infection (URI) is also sometimes known as the common cold. The upper respiratory tract includes the nose, sinuses, throat, trachea, and bronchi. Bronchi are the airways leading to the lungs. Most people improve within 1 week, but symptoms can last up to 2 weeks. A residual cough may last even longer.  CAUSES Many different viruses can infect the tissues lining the upper respiratory tract. The tissues become irritated and inflamed and often become very moist. Mucus production is also common. A cold is contagious. You can easily spread the virus to others by oral contact. This includes kissing, sharing a glass, coughing, or sneezing. Touching your mouth or nose and then touching a surface, which is then touched by another person, can also spread the virus. SYMPTOMS  Symptoms typically develop 1 to 3 days after you come in contact with a cold virus. Symptoms vary from person to person. They may include:  Runny nose.  Sneezing.  Nasal congestion.  Sinus irritation.  Sore throat.  Loss of voice (laryngitis).  Cough.  Fatigue.  Muscle aches.  Loss of appetite.  Headache.  Low-grade fever. DIAGNOSIS  You might diagnose your own cold based on familiar symptoms, since most people get a cold 2 to 3 times a year. Your caregiver can confirm this based on your exam. Most importantly, your caregiver can check that your symptoms are not due to another disease such as strep throat, sinusitis, pneumonia, asthma, or epiglottitis. Blood tests, throat tests, and X-rays are not necessary to diagnose a common cold, but they may sometimes be helpful in excluding other more serious diseases. Your caregiver will decide if any further tests are required. RISKS AND COMPLICATIONS  You may be at risk for a more severe case of the common cold if you smoke cigarettes, have chronic heart disease (such as heart failure) or lung disease (such as asthma), or if  you have a weakened immune system. The very young and very old are also at risk for more serious infections. Bacterial sinusitis, middle ear infections, and bacterial pneumonia can complicate the common cold. The common cold can worsen asthma and chronic obstructive pulmonary disease (COPD). Sometimes, these complications can require emergency medical care and may be life-threatening. PREVENTION  The best way to protect against getting a cold is to practice good hygiene. Avoid oral or hand contact with people with cold symptoms. Wash your hands often if contact occurs. There is no clear evidence that vitamin C, vitamin E, echinacea, or exercise reduces the chance of developing a cold. However, it is always recommended to get plenty of rest and practice good nutrition. TREATMENT  Treatment is directed at relieving symptoms. There is no cure. Antibiotics are not effective, because the infection is caused by a virus, not by bacteria. Treatment may include:  Increased fluid intake. Sports drinks offer valuable electrolytes, sugars, and fluids.  Breathing heated mist or steam (vaporizer or shower).  Eating chicken soup or other clear broths, and maintaining good nutrition.  Getting plenty of rest.  Using gargles or lozenges for comfort.  Controlling fevers with ibuprofen or acetaminophen as directed by your caregiver.  Increasing usage of your inhaler if you have asthma. Zinc gel and zinc lozenges, taken in the first 24 hours of the common cold, can shorten the duration and lessen the severity of symptoms. Pain medicines may help with fever, muscle aches, and throat pain. A variety of non-prescription medicines are available to treat congestion and runny nose. Your caregiver   can make recommendations and may suggest nasal or lung inhalers for other symptoms.  HOME CARE INSTRUCTIONS   Only take over-the-counter or prescription medicines for pain, discomfort, or fever as directed by your  caregiver.  Use a warm mist humidifier or inhale steam from a shower to increase air moisture. This may keep secretions moist and make it easier to breathe.  Drink enough water and fluids to keep your urine clear or pale yellow.  Rest as needed.  Return to work when your temperature has returned to normal or as your caregiver advises. You may need to stay home longer to avoid infecting others. You can also use a face mask and careful hand washing to prevent spread of the virus. SEEK MEDICAL CARE IF:   After the first few days, you feel you are getting worse rather than better.  You need your caregiver's advice about medicines to control symptoms.  You develop chills, worsening shortness of breath, or brown or red sputum. These may be signs of pneumonia.  You develop yellow or brown nasal discharge or pain in the face, especially when you bend forward. These may be signs of sinusitis.  You develop a fever, swollen neck glands, pain with swallowing, or white areas in the back of your throat. These may be signs of strep throat. SEEK IMMEDIATE MEDICAL CARE IF:   You have a fever.  You develop severe or persistent headache, ear pain, sinus pain, or chest pain.  You develop wheezing, a prolonged cough, cough up blood, or have a change in your usual mucus (if you have chronic lung disease).  You develop sore muscles or a stiff neck. Document Released: 11/17/2000 Document Revised: 08/16/2011 Document Reviewed: 08/29/2013 ExitCare Patient Information 2015 Exit[employer]. This information is not intended to replace advice given to you by your health care provider. Make sure you discuss any questions you have with your health care provider.  

## 2014-11-18 NOTE — ED Notes (Addendum)
Pt c/o cough and congestion, generalized body aches x 2 days. Pt c/o diarrhea, loss of appetite, headache and sore throat. Pt states all this started yesterday after leaving pool

## 2014-11-18 NOTE — Progress Notes (Signed)
EDCM spoke to patient at bedside.  Patient reports she has Medicaid insurance.  She reports she has a list of physicians who accept Medicaid and is waiting for her new insurance card.

## 2014-11-18 NOTE — ED Provider Notes (Signed)
CSN: [id number]     Arrival date & time 11/18/14  1610 History   First MD Initiated Contact with Patient 11/18/14 1725     Chief Complaint  Patient presents with  . Nasal Congestion  . Cough     (Consider location/radiation/quality/duration/timing/severity/associated sxs/prior Treatment) HPI Comments: Patient here with cough and congestion symptoms 2 days. Notes bilateral ear pain, scratchy throat, productive cough. Denies any myalgias. No rashes. No headache or photophobia. No urinary symptoms. Does note increased sneezing. Slight watery diarrhea and loss of appetite. Has used over-the-counter medications without relief. Nothing makes her symptoms better or worse  Patient is a 24 y.o. female presenting with cough. The history is provided by the patient.  Cough   Past Medical History  Diagnosis Date  . Hypertension   . Fracture of foot     right   Past Surgical History  Procedure Laterality Date  . Cesarean section     History reviewed. No pertinent family history. History  Substance Use Topics  . Smoking status: Current Every Day Smoker -- 0.10 packs/day    Types: Cigarettes  . Smokeless tobacco: Not on file  . Alcohol Use: Yes     Comment: occ   OB History    No data available     Review of Systems  Respiratory: Positive for cough.   All other systems reviewed and are negative.     Allergies  Aleve  Home Medications   Prior to Admission medications   Medication Sig Start Date End Date Taking? Authorizing Provider  benzonatate (TESSALON) 100 MG capsule Take 1 capsule (100 mg total) by mouth every 8 (eight) hours. Patient not taking: Reported on 10/15/2014 07/23/14   Antony Madura, PA-C  HYDROcodone-acetaminophen (NORCO/VICODIN) 5-325 MG per tablet Take 1 tablet by mouth every 6 (six) hours as needed for moderate pain. Patient not taking: Reported on 10/15/2014 04/08/14   Earley Favor, NP  metroNIDAZOLE (FLAGYL) 500 MG tablet Take 1 tablet (500 mg total) by mouth 2  (two) times daily. Patient not taking: Reported on 11/18/2014 10/15/14   Lorre Nick, MD  predniSONE (DELTASONE) 20 MG tablet Take 2 tablets (40 mg total) by mouth daily. Patient not taking: Reported on 10/15/2014 07/23/14   Antony Madura, PA-C  sodium chloride (OCEAN) 0.65 % SOLN nasal spray Place 1 spray into both nostrils as needed for congestion. Patient not taking: Reported on 10/15/2014 07/23/14   Antony Madura, PA-C   BP 162/100 mmHg  Pulse 110  Temp(Src) 99.2 F (37.3 C) (Oral)  Resp 16  SpO2 100%  LMP 10/21/2014 (Exact Date) Physical Exam  Constitutional: She is oriented to person, place, and time. She appears well-developed and well-nourished.  Non-toxic appearance. No distress.  HENT:  Head: Normocephalic and atraumatic.  Eyes: Conjunctivae, EOM and lids are normal. Pupils are equal, round, and reactive to light.  Neck: Normal range of motion. Neck supple. No tracheal deviation present. No thyroid mass present.  Cardiovascular: Regular rhythm and normal heart sounds.  Tachycardia present.  Exam reveals no gallop.   No murmur heard. Pulmonary/Chest: Effort normal and breath sounds normal. No stridor. No respiratory distress. She has no decreased breath sounds. She has no wheezes. She has no rhonchi. She has no rales.  Abdominal: Soft. Normal appearance and bowel sounds are normal. She exhibits no distension. There is no tenderness. There is no rebound and no CVA tenderness.  Musculoskeletal: Normal range of motion. She exhibits no edema or tenderness.  Neurological: She is alert and oriented  to person, place, and time. She has normal strength. No cranial nerve deficit or sensory deficit. GCS eye subscore is 4. GCS verbal subscore is 5. GCS motor subscore is 6.  Skin: Skin is warm and dry. No abrasion and no rash noted.  Psychiatric: She has a normal mood and affect. Her speech is normal and behavior is normal.  Nursing note and vitals reviewed.   ED Course  Procedures (including  critical care time) Labs Review Labs Reviewed  CBC WITH DIFFERENTIAL/PLATELET - Abnormal; Notable for the following:    RBC 5.47 (*)    Monocytes Relative 18 (*)    Monocytes Absolute 1.5 (*)    All other components within normal limits  COMPREHENSIVE METABOLIC PANEL - Abnormal; Notable for the following:    Glucose, Bld 138 (*)    Creatinine, Ser 1.27 (*)    Total Protein 8.4 (*)    GFR calc non Af Amer 59 (*)    All other components within normal limits  URINALYSIS, ROUTINE W REFLEX MICROSCOPIC (NOT AT [facility])  POC URINE PREG, ED    Imaging Review No results found.   EKG Interpretation None      MDM   Final diagnoses:  Chest pain    Patient with likely viral illness. Stable for discharge    Lorre Nick, MD 11/18/14 [place]
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
## 2016-03-20 ENCOUNTER — Emergency Department (HOSPITAL_COMMUNITY)
Admission: EM | Admit: 2016-03-20 | Discharge: 2016-03-20 | Disposition: A | Discharge status: Home / Self Care | Payer: Medicaid Other | Attending: Emergency Medicine | Admitting: Emergency Medicine

## 2016-03-20 ENCOUNTER — Encounter (HOSPITAL_COMMUNITY): Payer: Self-pay | Admitting: Emergency Medicine

## 2016-03-20 DIAGNOSIS — N72 Inflammatory disease of cervix uteri: Secondary | ICD-10-CM

## 2016-03-20 DIAGNOSIS — I10 Essential (primary) hypertension: Secondary | ICD-10-CM | POA: Insufficient documentation

## 2016-03-20 DIAGNOSIS — F17210 Nicotine dependence, cigarettes, uncomplicated: Secondary | ICD-10-CM | POA: Insufficient documentation

## 2016-03-20 LAB — WET PREP, GENITAL
Sperm: NONE SEEN
TRICH WET PREP: NONE SEEN
YEAST WET PREP: NONE SEEN

## 2016-03-20 LAB — URINALYSIS, ROUTINE W REFLEX MICROSCOPIC
Bilirubin Urine: NEGATIVE
Glucose, UA: NEGATIVE mg/dL
HGB URINE DIPSTICK: NEGATIVE
Ketones, ur: NEGATIVE mg/dL
LEUKOCYTES UA: NEGATIVE
NITRITE: NEGATIVE
Protein, ur: NEGATIVE mg/dL
SPECIFIC GRAVITY, URINE: 1.015 (ref 1.005–1.030)
pH: 7 (ref 5.0–8.0)

## 2016-03-20 LAB — POC URINE PREG, ED: PREG TEST UR: NEGATIVE

## 2016-03-20 LAB — POC OCCULT BLOOD, ED: Fecal Occult Bld: NEGATIVE

## 2016-03-20 MED ORDER — CEFTRIAXONE SODIUM 250 MG IJ SOLR
250.0000 mg | Freq: Once | INTRAMUSCULAR | Status: AC
  Administered 2016-03-20: 250 mg via INTRAMUSCULAR
  Filled 2016-03-20: qty 250

## 2016-03-20 MED ORDER — AZITHROMYCIN 250 MG PO TABS
1000.0000 mg | ORAL_TABLET | Freq: Once | ORAL | Status: AC
  Administered 2016-03-20: 1000 mg via ORAL
  Filled 2016-03-20: qty 4

## 2016-03-20 MED ORDER — METRONIDAZOLE 500 MG PO TABS: 2000.0000 mg | ORAL_TABLET | Freq: Once | ORAL | 0 refills | Status: AC

## 2016-03-20 MED ORDER — STERILE WATER FOR INJECTION IJ SOLN
INTRAMUSCULAR | Status: AC
  Administered 2016-03-20: 10 mL
  Filled 2016-03-20: qty 10

## 2016-03-20 NOTE — Discharge Instructions (Signed)
Take Tylenol or Advil as directed for pain. Use a condom each time that you have sex. Contact the women's outpatient center in 2 days to arrange for the next available appointment

## 2016-03-20 NOTE — ED Provider Notes (Addendum)
[facility]-EMERGENCY DEPT Provider Note   CSN: [id number] Arrival date & time: 03/20/16  [id number]     History   Chief Complaint Chief Complaint  Patient presents with  . Rectal Pain    HPI Wendy Waters is a 25 y.o. female.Complains of pain at rectum and at perineum worse with urinating and worse with defecating for the past 2 weeks. Pain is also worse with sitting in certain positions and improved with lying supine. She denies abdominal pain denies anorexia denies fever. Other associated symptoms include dysparuenia. No treatment prior to coming here. No other associated symptomsPain is mild at present.   HPI  Past Medical History:  Diagnosis Date  . Fracture of foot    right  . Hypertension     There are no active problems to display for this patient.   Past Surgical History:  Procedure Laterality Date  . CESAREAN SECTION      OB History    No data available       Home Medications    Prior to Admission medications   Not on File    Family History No family history on file.  Social History Social History  Substance Use Topics  . Smoking status: Current Every Day Smoker    Packs/day: 0.10    Types: Cigarettes  . Smokeless tobacco: Never Used  . Alcohol use Yes     Comment: occ     Allergies   Aleve [naproxen sodium]   Review of Systems Review of Systems  Constitutional: Negative.   HENT: Negative.   Respiratory: Negative.   Cardiovascular: Negative.   Gastrointestinal: Positive for rectal pain. Negative for anal bleeding.  Genitourinary: Positive for dyspareunia and dysuria.       Currently on menses  Musculoskeletal: Negative.   Skin: Negative.   Neurological: Negative.   Psychiatric/Behavioral: Negative.   All other systems reviewed and are negative.    Physical Exam Updated Vital Signs BP 129/76   Pulse 82   Temp 98.5 F (36.9 C)   Resp 14   LMP 03/13/2016   SpO2 100%   Physical Exam  Constitutional: She appears well-developed and  well-nourished.  HENT:  Head: Normocephalic and atraumatic.  Eyes: Conjunctivae are normal. Pupils are equal, round, and reactive to light.  Neck: Neck supple. No tracheal deviation present. No thyromegaly present.  Cardiovascular: Normal rate and regular rhythm.   No murmur heard. Pulmonary/Chest: Effort normal and breath sounds normal.  Abdominal: Soft. Bowel sounds are normal. She exhibits no distension. There is tenderness.  Obese, suprapubic tenderness, mild  Genitourinary: Rectal exam shows guaiac negative stool.  Genitourinary Comments: Rectal normal tone brown stool, and no gross blood, no tenderness pelvic exam no external lesion cervical os closed. Slight amount of brownish discharge in vault. Positive cervical motion tenderness no adnexal masses or tenderness  Musculoskeletal: Normal range of motion. She exhibits no edema or tenderness.  Neurological: She is alert. Coordination normal.  Skin: Skin is warm and dry. No rash noted.  Psychiatric: She has a normal mood and affect.  Nursing note and vitals reviewed.    ED Treatments / Results  Labs (all labs ordered are listed, but only abnormal results are displayed) Labs Reviewed  RPR  HIV ANTIBODY (ROUTINE TESTING)  URINALYSIS, ROUTINE W REFLEX MICROSCOPIC (NOT AT [facility])  POC OCCULT BLOOD, ED  POC URINE PREG, ED  GC/CHLAMYDIA PROBE AMP ([place]) NOT AT [facility]    EKG  EKG Interpretation None  Radiology No results found.  Procedures Procedures (including critical care time)  Medications Ordered in ED Medications - No data to display Results for orders placed or performed during the hospital encounter of 03/20/16  Wet prep, genital  Result Value Ref Range   Yeast Wet Prep HPF POC NONE SEEN NONE SEEN   Trich, Wet Prep NONE SEEN NONE SEEN   Clue Cells Wet Prep HPF POC PRESENT (A) NONE SEEN   WBC, Wet Prep HPF POC MANY (A) NONE SEEN   Sperm NONE SEEN   Urinalysis, Routine w reflex microscopic (not at  [facility])  Result Value Ref Range   Color, Urine YELLOW YELLOW   APPearance CLEAR CLEAR   Specific Gravity, Urine 1.015 1.005 - 1.030   pH 7.0 5.0 - 8.0   Glucose, UA NEGATIVE NEGATIVE mg/dL   Hgb urine dipstick NEGATIVE NEGATIVE   Bilirubin Urine NEGATIVE NEGATIVE   Ketones, ur NEGATIVE NEGATIVE mg/dL   Protein, ur NEGATIVE NEGATIVE mg/dL   Nitrite NEGATIVE NEGATIVE   Leukocytes, UA NEGATIVE NEGATIVE  POC occult blood, ED Provider will collect  Result Value Ref Range   Fecal Occult Bld NEGATIVE NEGATIVE  POC urine preg, ED (not at [facility])  Result Value Ref Range   Preg Test, Ur NEGATIVE NEGATIVE   No results found.  Initial Impression / Assessment and Plan / ED Course  I have reviewed the triage vital signs and the nursing notes.  Pertinent labs & imaging results that were available during my care of the patient were reviewed by me and considered in my medical decision making (see chart for details).  Clinical Course    Clinically patient has cervicitis wet prep consistent with vaginitis Plan Rocephin, Zithromax prior to discharge. Prescription Flagyl. Tylenol or ibuprofen for pain. For a women's outpatient clinic. Safe sex encouraged  Final Clinical Impressions(s) / ED Diagnoses   Final diagnoses:  None   Diagnoses #1 cervicitis #2 vaginitis New Prescriptions New Prescriptions   No medications on file     Doug Sou, MD 03/20/16 1317    Doug Sou, MD 03/20/16 1318    Doug Sou, MD 03/20/16 1322

## 2016-03-20 NOTE — ED Triage Notes (Addendum)
Patient c/o rectal pain that has been going on for over week.  Patient states that she has been having loose stools and not constipated patient denies any blood when using restroom.  Patient states that she has pain constantly. Patient states that it is worse when she uses restroom.

## 2016-03-21 LAB — HIV ANTIBODY (ROUTINE TESTING W REFLEX): HIV SCREEN 4TH GENERATION: NONREACTIVE

## 2016-03-21 LAB — RPR: RPR Ser Ql: NONREACTIVE

## 2016-03-22 LAB — GC/CHLAMYDIA PROBE AMP (~~LOC~~) NOT AT ARMC
Chlamydia: NEGATIVE
Neisseria Gonorrhea: NEGATIVE

## 2016-04-17 ENCOUNTER — Encounter (HOSPITAL_COMMUNITY): Payer: Self-pay | Admitting: Emergency Medicine

## 2016-04-17 ENCOUNTER — Emergency Department (HOSPITAL_COMMUNITY)
Admission: EM | Admit: 2016-04-17 | Discharge: 2016-04-17 | Disposition: A | Discharge status: Home / Self Care | Payer: Medicaid Other | Attending: Emergency Medicine | Admitting: Emergency Medicine

## 2016-04-17 DIAGNOSIS — I10 Essential (primary) hypertension: Secondary | ICD-10-CM | POA: Insufficient documentation

## 2016-04-17 DIAGNOSIS — R519 Headache, unspecified: Secondary | ICD-10-CM

## 2016-04-17 DIAGNOSIS — F17210 Nicotine dependence, cigarettes, uncomplicated: Secondary | ICD-10-CM | POA: Insufficient documentation

## 2016-04-17 DIAGNOSIS — R51 Headache: Secondary | ICD-10-CM | POA: Insufficient documentation

## 2016-04-17 LAB — POC URINE PREG, ED: Preg Test, Ur: NEGATIVE

## 2016-04-17 MED ORDER — METOCLOPRAMIDE HCL 5 MG/ML IJ SOLN
10.0000 mg | Freq: Once | INTRAMUSCULAR | Status: AC
  Administered 2016-04-17: 10 mg via INTRAVENOUS
  Filled 2016-04-17: qty 2

## 2016-04-17 MED ORDER — DEXAMETHASONE SODIUM PHOSPHATE 10 MG/ML IJ SOLN
10.0000 mg | Freq: Once | INTRAMUSCULAR | Status: AC
  Administered 2016-04-17: 10 mg via INTRAVENOUS
  Filled 2016-04-17: qty 1

## 2016-04-17 MED ORDER — SODIUM CHLORIDE 0.9 % IV BOLUS (SEPSIS)
1000.0000 mL | Freq: Once | INTRAVENOUS | Status: AC
  Administered 2016-04-17: 1000 mL via INTRAVENOUS

## 2016-04-17 MED ORDER — DIPHENHYDRAMINE HCL 50 MG/ML IJ SOLN
25.0000 mg | Freq: Once | INTRAMUSCULAR | Status: AC
  Administered 2016-04-17: 25 mg via INTRAVENOUS
  Filled 2016-04-17: qty 1

## 2016-04-17 NOTE — ED Triage Notes (Signed)
Per pt, states headache and nausea for a few days-no taking anything for the headaches

## 2016-04-17 NOTE — ED Notes (Signed)
Bed: WA20 Expected date:  Expected time:  Means of arrival:  Comments: 

## 2016-04-17 NOTE — Discharge Instructions (Signed)
May wish to take 800mg  motrin, tylenol, or excedrin migraine should headache recur. Follow-up with neurology if you continue having issues with bad headaches-- call their office for appt. Otherwise, you may follow-up with your primary care doctor. Return to the ED for new or worsening symptoms.

## 2016-04-17 NOTE — ED Provider Notes (Signed)
[facility]-EMERGENCY DEPT Provider Note   CSN: [id number] Arrival date & time: 04/17/16  1017     History   Chief Complaint Chief Complaint  Patient presents with  . Migraine    HPI Steffanie Dunnytisa Kye is a 25 y.o. female.  The history is provided by the patient and medical records.  Migraine  Associated symptoms include headaches.    25 year old female with history of hypertension, presenting to the ED for migraine headache. She reports this is been constant for the past 3 days. States localized throughout her entire head, but worse across her forehead. Pain is described as a throbbing sensation. She reports associated nausea and dry heaving. No active emesis. States she has felt mildly lightheaded recently. She denies any dizziness, focal numbness or weakness, confusion, changes in speech, or difficulty walking.  She is not currently on anticoagulation. She denies any fever, chills, or neck pain. She has not taken anything for her headache today.  Past Medical History:  Diagnosis Date  . Fracture of foot    right  . Hypertension     There are no active problems to display for this patient.   Past Surgical History:  Procedure Laterality Date  . CESAREAN SECTION      OB History    No data available       Home Medications    Prior to Admission medications   Not on File    Family History No family history on file.  Social History Social History  Substance Use Topics  . Smoking status: Current Every Day Smoker    Packs/day: 0.10    Types: Cigarettes  . Smokeless tobacco: Never Used  . Alcohol use Yes     Comment: occ     Allergies   Aleve [naproxen sodium]   Review of Systems Review of Systems  Gastrointestinal: Positive for nausea.  Neurological: Positive for light-headedness and headaches.  All other systems reviewed and are negative.    Physical Exam Updated Vital Signs BP 135/85 (BP Location: Left Arm)   Pulse 104   Temp 98.3 F (36.8 C) (Oral)    Resp 16   LMP 01/12/2016   SpO2 100%   Physical Exam  Constitutional: She is oriented to person, place, and time. She appears well-developed and well-nourished. No distress.  HENT:  Head: Normocephalic and atraumatic.  Right Ear: External ear normal.  Left Ear: External ear normal.  Mouth/Throat: Oropharynx is clear and moist.  Eyes: Conjunctivae and EOM are normal. Pupils are equal, round, and reactive to light.  Neck: Normal range of motion and full passive range of motion without pain. Neck supple. No neck rigidity.  No rigidity, no meningismus  Cardiovascular: Normal rate, regular rhythm and normal heart sounds.   No murmur heard. Pulmonary/Chest: Effort normal and breath sounds normal. No respiratory distress. She has no wheezes. She has no rhonchi.  Abdominal: Soft. Bowel sounds are normal. There is no tenderness. There is no guarding.  Musculoskeletal: Normal range of motion. She exhibits no edema.  Neurological: She is alert and oriented to person, place, and time. She has normal strength. She displays no tremor. No cranial nerve deficit or sensory deficit. She displays no seizure activity.  AAOx3, answering questions and following commands appropriately; equal strength UE and LE bilaterally; CN grossly intact; moves all extremities appropriately without ataxia; no focal neuro deficits or facial asymmetry appreciated  Skin: Skin is warm and dry. No rash noted. She is not diaphoretic.  Psychiatric: She has a normal  mood and affect. Her behavior is normal. Thought content normal.  Nursing note and vitals reviewed.    ED Treatments / Results  Labs (all labs ordered are listed, but only abnormal results are displayed) Labs Reviewed  POC URINE PREG, ED    EKG  EKG Interpretation None       Radiology No results found.  Procedures Procedures (including critical care time)  Medications Ordered in ED Medications - No data to display   Initial Impression /  Assessment and Plan / ED Course  I have reviewed the triage vital signs and the nursing notes.  Pertinent labs & imaging results that were available during my care of the patient were reviewed by me and considered in my medical decision making (see chart for details).  Clinical Course    25 year old female here with headache for the past 3 days. She is afebrile and nontoxic. Neurologic exam is nonfocal. No signs or symptoms concerning for meningitis. Patient was treated here with migraine cocktail with resolution of symptoms. She is tolerating oral fluids well. Feel she is stable for discharge home. She was given neurology follow-up should she continue having issues with headaches.  Recommended motrin, tylenol, or excedrin in the interim.  Discussed plan with patient, she acknowledged understanding and agreed with plan of care.  Return precautions given for new or worsening symptoms.  Final Clinical Impressions(s) / ED Diagnoses   Final diagnoses:  Acute nonintractable headache, unspecified headache type    New Prescriptions There are no discharge medications for this patient.    Garlon HatchetLisa M Edythe Riches, PA-C 04/17/16 1814    Melene Planan Floyd, DO 04/18/16 1243

## 2016-06-20 ENCOUNTER — Ambulatory Visit (HOSPITAL_COMMUNITY)
Admission: EM | Admit: 2016-06-20 | Discharge: 2016-06-20 | Disposition: A | Discharge status: Home / Self Care | Payer: Medicaid Other | Attending: Emergency Medicine | Admitting: Emergency Medicine

## 2016-06-20 DIAGNOSIS — H9201 Otalgia, right ear: Secondary | ICD-10-CM

## 2016-06-20 DIAGNOSIS — H65191 Other acute nonsuppurative otitis media, right ear: Secondary | ICD-10-CM

## 2016-06-20 MED ORDER — CEFTRIAXONE SODIUM 1 G IJ SOLR
INTRAMUSCULAR | Status: AC
  Filled 2016-06-20: qty 10

## 2016-06-20 MED ORDER — ANTIPYRINE-BENZOCAINE 5.4-1.4 % OT SOLN: 3.0000 [drp] | OTIC | 0 refills | Status: DC | PRN

## 2016-06-20 MED ORDER — TRAMADOL HCL 50 MG PO TABS: 50.0000 mg | ORAL_TABLET | Freq: Four times a day (QID) | ORAL | 0 refills | Status: DC | PRN

## 2016-06-20 MED ORDER — CEFTRIAXONE SODIUM 1 G IJ SOLR
1.0000 g | Freq: Once | INTRAMUSCULAR | Status: AC
  Administered 2016-06-20: 1 g via INTRAMUSCULAR

## 2016-06-20 MED ORDER — TRAMADOL HCL 50 MG PO TABS: 50.0000 mg | ORAL_TABLET | Freq: Four times a day (QID) | ORAL | Status: DC

## 2016-06-20 NOTE — ED Provider Notes (Signed)
CSN: [id number]     Arrival date & time 06/20/16  1208 History   First MD Initiated Contact with Patient 06/20/16 1306     Chief Complaint  Patient presents with  . Otalgia    right ear pain for 3 days getting worse   (Consider location/radiation/quality/duration/timing/severity/associated sxs/prior Treatment) Pt c/o rt ear pain for 3 days. Pain is in the deep area of the ear and in the middle of the ear. Has been taking motrin, and a friend has given her 3 days of amoxicillin that she is taking with no relief. Denies any hear loss. Denies any fevers. C/o pain. Pt also states that she does not have any money to fill rx. Asked for a shot while in the office. Denies any URI.        Past Medical History:  Diagnosis Date  . Fracture of foot    right  . Hypertension    Past Surgical History:  Procedure Laterality Date  . CESAREAN SECTION     No family history on file. Social History  Substance Use Topics  . Smoking status: Current Every Day Smoker    Packs/day: 0.10    Types: Cigarettes  . Smokeless tobacco: Never Used  . Alcohol use Yes     Comment: occ   OB History    No data available     Review of Systems  Constitutional: Negative.   HENT: Positive for ear pain.   Eyes: Negative.   Respiratory: Negative.   Cardiovascular: Negative.   Gastrointestinal: Negative.   Skin: Negative.   Neurological: Negative.     Allergies  Aleve [naproxen sodium]  Home Medications   Prior to Admission medications   Medication Sig Start Date End Date Taking? Authorizing Provider  antipyrine-benzocaine Lyla Son(AURALGAN) otic solution Place 3-4 drops into the right ear every 2 (two) hours as needed for ear pain. 06/20/16   Tobi BastosMelanie A Emett Stapel, NP   Meds Ordered and Administered this Visit   Medications  cefTRIAXone (ROCEPHIN) injection 1 g (not administered)  traMADol (ULTRAM) tablet 50 mg (not administered)    BP 138/89 (BP Location: Right Arm)   Pulse 76   Temp 98.6 F (37 C)  (Oral)   SpO2 100%  No data found.   Physical Exam  Constitutional: She appears well-developed.  HENT:  Nose: Nose normal.  Mouth/Throat: Oropharynx is clear and moist.  RT ear erythema internal and external, fluid line visible. Moderate amount of wax in canal also.   Eyes: Pupils are equal, round, and reactive to light.  Neck: Normal range of motion.  Cardiovascular: Normal rate.   Pulmonary/Chest: Effort normal.  Musculoskeletal: Normal range of motion.  Neurological: She is alert.  Skin: Skin is warm.    Urgent Care Course   Clinical Course     Procedures (including critical care time)  Labs Review Labs Reviewed - No data to display  Imaging Review No results found.           MDM   1. Other acute nonsuppurative otitis media of right ear, recurrence not specified   2. Otalgia of right ear    Do not stick anything in the ear.  May use the drops to help with discomfort Stop taking the amoxicillin we have given you a shot today for infection. May take the pain meds as needed.     Tobi BastosMelanie A Ventura Hollenbeck, NP 06/20/16 1340

## 2016-08-02 ENCOUNTER — Emergency Department (HOSPITAL_COMMUNITY)
Admission: EM | Admit: 2016-08-02 | Discharge: 2016-08-02 | Disposition: A | Discharge status: Home / Self Care | Payer: Medicaid Other | Attending: Emergency Medicine | Admitting: Emergency Medicine

## 2016-08-02 ENCOUNTER — Encounter (HOSPITAL_COMMUNITY): Payer: Self-pay

## 2016-08-02 DIAGNOSIS — I10 Essential (primary) hypertension: Secondary | ICD-10-CM | POA: Insufficient documentation

## 2016-08-02 DIAGNOSIS — Z79899 Other long term (current) drug therapy: Secondary | ICD-10-CM | POA: Insufficient documentation

## 2016-08-02 DIAGNOSIS — H6121 Impacted cerumen, right ear: Secondary | ICD-10-CM | POA: Insufficient documentation

## 2016-08-02 DIAGNOSIS — F17210 Nicotine dependence, cigarettes, uncomplicated: Secondary | ICD-10-CM | POA: Insufficient documentation

## 2016-08-02 NOTE — ED Notes (Signed)
Pts right ear is soaking in sterile saline at this time.

## 2016-08-02 NOTE — ED Notes (Signed)
Pt's ear has been irrigated multiple times by this RN and DR. Steinl. Per MD steinl will attempt once more with warm saline.

## 2016-08-02 NOTE — ED Notes (Signed)
Pt stable, understands discharge instructions, and reasons for return.   

## 2016-08-02 NOTE — ED Triage Notes (Signed)
Patient complains of ongoing right ear pain x 1 month. [facility] and couldn't afford meds, NAD

## 2016-08-02 NOTE — ED Provider Notes (Signed)
[facility]-EMERGENCY DEPT Provider Note   CSN: [id number] Arrival date & time: 08/02/16  1226  By signing my name below, I, Marnette Burgess Long, attest that this documentation has been prepared under the direction and in the presence of Cathren Laine, MD . Electronically Signed: Marnette Burgess Long, Scribe. 08/02/2016. 1:39 PM.    History   Chief Complaint No chief complaint on file.  The history is provided by the patient. No language interpreter was used.    HPI Comments:  Minh Jasper is a 26 y.o. female with a PMHx of HTN, who presents to the Emergency Department complaining of constant, severe, right ear pain onset one month. She reports being seen at [facility] on 06/20/16 but did not fill any of the medications s/p the visit d/t lack of ability to pay. She has associated symptoms of reduced hearing in that ear. She has not taken anything at home for relief of her pain. Pt denies fever, abdominal pain. CP, and any other associated symptoms at this time. Pt is a current every day smoker.     Past Medical History:  Diagnosis Date  . Fracture of foot    right  . Hypertension     There are no active problems to display for this patient.   Past Surgical History:  Procedure Laterality Date  . CESAREAN SECTION      OB History    No data available       Home Medications    Prior to Admission medications   Medication Sig Start Date End Date Taking? Authorizing Provider  antipyrine-benzocaine Lyla Son) otic solution Place 3-4 drops into the right ear every 2 (two) hours as needed for ear pain. 06/20/16   Tobi Bastos, NP  traMADol (ULTRAM) 50 MG tablet Take 1 tablet (50 mg total) by mouth every 6 (six) hours as needed. 06/20/16   Tobi Bastos, NP    Family History No family history on file.  Social History Social History  Substance Use Topics  . Smoking status: Current Every Day Smoker    Packs/day: 0.10    Types: Cigarettes  . Smokeless tobacco: Never Used  . Alcohol use  Yes     Comment: occ     Allergies   Aleve [naproxen sodium]   Review of Systems Review of Systems  Constitutional: Negative for fever.  HENT: Positive for ear pain. Negative for hearing loss, sore throat and tinnitus.   Cardiovascular: Negative for chest pain.  Gastrointestinal: Negative for abdominal pain.  Neurological: Negative for headaches.     Physical Exam Updated Vital Signs BP 150/86   Pulse 78   Temp 99.2 F (37.3 C) (Oral)   Resp 18   SpO2 99%   Physical Exam  Constitutional: She appears well-developed and well-nourished.  HENT:  Head: Normocephalic.  Nose: Nose normal.  Mouth/Throat: Oropharynx is clear and moist.  Right ear cerumen impaction   Eyes: Conjunctivae are normal.  Cardiovascular: Normal rate.   Pulmonary/Chest: Effort normal.  Abdominal: She exhibits no distension.  Musculoskeletal: She exhibits no edema.  Neurological: She is alert.  Skin: Skin is warm and dry.  Psychiatric: She has a normal mood and affect.  Nursing note and vitals reviewed.    ED Treatments / Results  DIAGNOSTIC STUDIES:  Oxygen Saturation is 99% on RA, normal by my interpretation.    COORDINATION OF CARE:  1:36 PM Discussed treatment plan with pt at bedside including ear irrigation and pt agreed to plan.  Labs (all labs  ordered are listed, but only abnormal results are displayed) Labs Reviewed - No data to display  EKG  EKG Interpretation None       Radiology No results found.  Procedures Procedures (including critical care time)  Medications Ordered in ED Medications - No data to display   Initial Impression / Assessment and Plan / ED Course  I have reviewed the triage vital signs and the nursing notes.  Pertinent labs & imaging results that were available during my care of the patient were reviewed by me and considered in my medical decision making (see chart for details).  Nursing to irrigate for cerumen impaction.  Recheck pt  improved.  Tm intact.     Final Clinical Impressions(s) / ED Diagnoses   Final diagnoses:  None    New Prescriptions New Prescriptions   No medications on file   I personally performed the services described in this documentation, which was scribed in my presence. The recorded information has been reviewed and considered. Cathren LaineKevin Krue Peterka, MD     Cathren LaineKevin Malichi Palardy, MD 08/02/16 [phone]

## 2016-08-02 NOTE — Discharge Instructions (Signed)
It was our pleasure to provide your ER care today - we hope that you feel better.  See earwax instruction sheet.    Follow up with primary care doctor in the coming week if symptoms fail to resolve.  Return to ER if worse, severe ear pain, other concern.

## 2016-09-10 ENCOUNTER — Emergency Department (HOSPITAL_COMMUNITY)
Admission: EM | Admit: 2016-09-10 | Discharge: 2016-09-10 | Disposition: A | Discharge status: Home / Self Care | Payer: Medicaid Other | Attending: Emergency Medicine | Admitting: Emergency Medicine

## 2016-09-10 ENCOUNTER — Encounter: Payer: Self-pay | Admitting: Emergency Medicine

## 2016-09-10 DIAGNOSIS — X58XXXA Exposure to other specified factors, initial encounter: Secondary | ICD-10-CM | POA: Insufficient documentation

## 2016-09-10 DIAGNOSIS — R51 Headache: Secondary | ICD-10-CM

## 2016-09-10 DIAGNOSIS — T161XXA Foreign body in right ear, initial encounter: Secondary | ICD-10-CM

## 2016-09-10 DIAGNOSIS — R519 Headache, unspecified: Secondary | ICD-10-CM

## 2016-09-10 DIAGNOSIS — Y929 Unspecified place or not applicable: Secondary | ICD-10-CM | POA: Insufficient documentation

## 2016-09-10 DIAGNOSIS — H6690 Otitis media, unspecified, unspecified ear: Secondary | ICD-10-CM

## 2016-09-10 DIAGNOSIS — Y939 Activity, unspecified: Secondary | ICD-10-CM | POA: Insufficient documentation

## 2016-09-10 DIAGNOSIS — H60391 Other infective otitis externa, right ear: Secondary | ICD-10-CM | POA: Insufficient documentation

## 2016-09-10 DIAGNOSIS — Y999 Unspecified external cause status: Secondary | ICD-10-CM | POA: Insufficient documentation

## 2016-09-10 DIAGNOSIS — R112 Nausea with vomiting, unspecified: Secondary | ICD-10-CM

## 2016-09-10 LAB — URINALYSIS, ROUTINE W REFLEX MICROSCOPIC
BILIRUBIN URINE: NEGATIVE
Glucose, UA: NEGATIVE mg/dL
HGB URINE DIPSTICK: NEGATIVE
KETONES UR: NEGATIVE mg/dL
LEUKOCYTES UA: NEGATIVE
NITRITE: NEGATIVE
PH: 7 (ref 5.0–8.0)
Protein, ur: 30 mg/dL — AB
Specific Gravity, Urine: 1.017 (ref 1.005–1.030)

## 2016-09-10 LAB — POC URINE PREG, ED: Preg Test, Ur: NEGATIVE

## 2016-09-10 MED ORDER — ONDANSETRON 4 MG PO TBDP: 4.0000 mg | ORAL_TABLET | Freq: Three times a day (TID) | ORAL | 0 refills | Status: AC | PRN

## 2016-09-10 MED ORDER — CIPROFLOXACIN-DEXAMETHASONE 0.3-0.1 % OT SUSP: 4.0000 [drp] | Freq: Two times a day (BID) | OTIC | 0 refills | Status: AC

## 2016-09-10 MED ORDER — AMOXICILLIN-POT CLAVULANATE 875-125 MG PO TABS: 1.0000 | ORAL_TABLET | Freq: Two times a day (BID) | ORAL | 0 refills | Status: DC

## 2016-09-10 MED ORDER — ONDANSETRON 8 MG PO TBDP
8.0000 mg | ORAL_TABLET | Freq: Once | ORAL | Status: AC
  Administered 2016-09-10: 8 mg via ORAL
  Filled 2016-09-10: qty 1

## 2016-09-10 NOTE — Discharge Instructions (Signed)
You had an earring back in your ear, which is what was causing your symptoms. Use the ear drops and the prescribed antibiotic as directed, until completed. Use zofran as needed for nausea. Stay well hydrated. Alternate between tylenol and motrin as needed for pain. AVOID PUTTING ANYTHING IN YOUR EARS! Don't go swimming until you're cleared by your regular doctor. Follow up with your regular doctor in 1 week for recheck of symptoms. Return to the ER for changes or worsening symptoms.

## 2016-09-10 NOTE — ED Notes (Signed)
Pt at registration window stating she is leaving and wants the result of her pregnancy. Stating she is only waiting a couple more minutes for a room.

## 2016-09-10 NOTE — ED Provider Notes (Signed)
[facility]-EMERGENCY DEPT Provider Note   CSN: [id number] Arrival date & time: 09/10/16  1602     History   Chief Complaint Chief Complaint  Patient presents with  . Nausea  . Headache    HPI Wendy Waters is a 26 y.o. female with a PMHx of HTN and migraines, who presents to the ED with complaints of intermittent nausea and vomiting 2 weeks as well as intermittent headaches. She is requesting a pregnancy test. She reports that she's had intermittent vomiting approximately 3-4 episodes per day of nonbloody nonbilious emesis, usually with specific foods like chicken or the smell of chicken/eggs, but improved with pizza and spicy foods as well as water intake. She has not tried anything else for her symptoms. She also reports intermittent headaches which are consistent with her migraines, states that currently she does not have a headache. She has regular menses, LMP was 07/27/16 therefore she is requesting a pregnancy test. She admits to regular alcohol consumption.  After evaluation, she mentions that she has had some R ear pain/drainage for months, was seen in the ED before and told she had a cerumen impaction, but she wanted to have this looked at as well. She denies putting anything in her ear that she's aware of. States the drainage is mostly black and sticky. Hasn't done anything for it.   She denies fevers, chills, lightheadedness, vision changes, CP, SOB, abd pain, diarrhea/constipation, obstipation, melena, hematochezia, hematemesis, hematuria, dysuria, vaginal bleeding/discharge, myalgias, arthralgias, numbness, tingling, focal weakness, or any other complaints at this time. Denies recent travel, sick contacts, suspicious food intake, NSAID use, or recent abx.    The history is provided by the patient and medical records. No language interpreter was used.  Headache   Associated symptoms include nausea and vomiting. Pertinent negatives include no fever and no shortness of breath.  Emesis     This is a new problem. The current episode started more than 1 week ago. The problem occurs 2 to 4 times per day. The problem has not changed since onset.The emesis has an appearance of stomach contents. There has been no fever. Associated symptoms include headaches (intermittent, none ongoing). Pertinent negatives include no abdominal pain, no arthralgias, no chills, no diarrhea, no fever and no myalgias.    Past Medical History:  Diagnosis Date  . Fracture of foot    right  . Hypertension     There are no active problems to display for this patient.   Past Surgical History:  Procedure Laterality Date  . CESAREAN SECTION      OB History    No data available       Home Medications    Prior to Admission medications   Not on File    Family History No family history on file.  Social History Social History  Substance Use Topics  . Smoking status: Current Every Day Smoker    Packs/day: 0.10    Types: Cigarettes  . Smokeless tobacco: Never Used  . Alcohol use Yes     Comment: occ     Allergies   Aleve [naproxen sodium]   Review of Systems Review of Systems  Constitutional: Negative for chills and fever.  HENT: Positive for ear discharge and ear pain.   Eyes: Negative for visual disturbance.  Respiratory: Negative for shortness of breath.   Cardiovascular: Negative for chest pain.  Gastrointestinal: Positive for nausea and vomiting. Negative for abdominal pain, blood in stool, constipation and diarrhea.  Genitourinary: Negative for dysuria, hematuria,  vaginal bleeding and vaginal discharge.  Musculoskeletal: Negative for arthralgias and myalgias.  Skin: Negative for color change.  Allergic/Immunologic: Negative for immunocompromised state.  Neurological: Positive for headaches (intermittent, none ongoing). Negative for weakness, light-headedness and numbness.  Psychiatric/Behavioral: Negative for confusion.   10 Systems reviewed and are negative for acute  change except as noted in the HPI.   Physical Exam Updated Vital Signs BP (!) 147/89 (BP Location: Right Arm)   Pulse (!) 106   Temp 99 F (37.2 C) (Oral)   Resp 18   Ht  (1.575 m)   Wt 81.6 kg   SpO2 100%   BMI 32.92 kg/m   Physical Exam  Constitutional: She is oriented to person, place, and time. Vital signs are normal. She appears well-developed and well-nourished.  Non-toxic appearance. No distress.  Afebrile, nontoxic, NAD  HENT:  Head: Normocephalic and atraumatic.  Right Ear: There is drainage, swelling and tenderness. A foreign body is present.  Left Ear: Hearing, tympanic membrane, external ear and ear canal normal.  Mouth/Throat: Oropharynx is clear and moist and mucous membranes are normal.  L ear clear.  R ear canal with purulent drainage, macerated and friable skin, mild swelling of canal, with retained FB deep within the canal, which was later removed and identified as the back of an earring. TM not completely visualized due to the canal swelling/bleeding after procedure, unable to see if there is any erythema/bulging or perforation of the TM.   Eyes: Conjunctivae and EOM are normal. Pupils are equal, round, and reactive to light. Right eye exhibits no discharge. Left eye exhibits no discharge.  PERRL, EOMI, no nystagmus, no visual field deficits   Neck: Normal range of motion. Neck supple. No spinous process tenderness and no muscular tenderness present. No neck rigidity. Normal range of motion present.  FROM intact without spinous process TTP, no bony stepoffs or deformities, no paraspinous muscle TTP or muscle spasms. No rigidity or meningeal signs. No bruising or swelling.   Cardiovascular: Normal rate, regular rhythm, normal heart sounds and intact distal pulses.  Exam reveals no gallop and no friction rub.   No murmur heard. Pulmonary/Chest: Effort normal and breath sounds normal. No respiratory distress. She has no decreased breath sounds. She has no wheezes.  She has no rhonchi. She has no rales.  Abdominal: Soft. Normal appearance and bowel sounds are normal. She exhibits no distension. There is no tenderness. There is no rigidity, no rebound, no guarding, no CVA tenderness, no tenderness at McBurney's point and negative Murphy's sign.  Soft, NTND, +BS throughout, no r/g/r, neg murphy's, neg mcburney's, no CVA TTP   Musculoskeletal: Normal range of motion.  MAE x4 Strength and sensation grossly intact in all extremities Distal pulses intact Gait steady  Neurological: She is alert and oriented to person, place, and time. She has normal strength. No cranial nerve deficit or sensory deficit. Coordination and gait normal. GCS eye subscore is 4. GCS verbal subscore is 5. GCS motor subscore is 6.  CN 2-12 grossly intact A&O x4 GCS 15 Sensation and strength intact Gait nonataxic including with tandem walking Coordination with finger-to-nose WNL Neg pronator drift   Skin: Skin is warm, dry and intact. No rash noted.  Psychiatric: She has a normal mood and affect.  Nursing note and vitals reviewed.    ED Treatments / Results  Labs (all labs ordered are listed, but only abnormal results are displayed) Labs Reviewed  URINALYSIS, ROUTINE W REFLEX MICROSCOPIC - Abnormal; Notable for  the following:       Result Value   APPearance HAZY (*)    Protein, ur 30 (*)    Bacteria, UA RARE (*)    Squamous Epithelial / LPF 6-30 (*)    All other components within normal limits  POC URINE PREG, ED    EKG  EKG Interpretation None       Radiology No results found.  Procedures .Foreign Body Removal Date/Time: 09/10/2016 5:25 PM Performed by: Rhona Raider Authorized by: Rhona Raider  Consent: Verbal consent obtained. Risks and benefits: risks, benefits and alternatives were discussed Consent given by: patient Patient understanding: patient states understanding of the procedure being performed Patient consent: the patient's understanding of  the procedure matches consent given Patient identity confirmed: verbally with patient Body area: ear Location details: right ear  Sedation: Patient sedated: no Patient restrained: no Patient cooperative: yes Localization method: ENT speculum, probed and visualized Removal mechanism: alligator forceps and curette Complexity: complex 1 objects recovered. Objects recovered: back of an earring Post-procedure assessment: foreign body removed Patient tolerance: Patient tolerated the procedure well with no immediate complications   (including critical care time)  Medications Ordered in ED Medications  ondansetron (ZOFRAN-ODT) disintegrating tablet 8 mg (8 mg Oral Given 09/10/16 1733)     Initial Impression / Assessment and Plan / ED Course  I have reviewed the triage vital signs and the nursing notes.  Pertinent labs & imaging results that were available during my care of the patient were reviewed by me and considered in my medical decision making (see chart for details).     26 y.o. female here with c/o intermittent headaches (currently resolved) and n/v intermittently x2 wks; requesting preg test. No abdominal tenderness, no focal neuro deficits, exam otherwise unremarkable. Later in the eval, mentions that she's had R ear pain/drainage for months and was told previously that it was a cerumen impaction, but it continues to have black sticky drainage and she requested that I look in her ear. Upon evaluation of the R ear, purulent drainage in the canal, canal slightly macerated and friable, mildly swollen, with what initially appeared to be cerumen impaction but upon further examination, found that there was a metallic foreign body inside the canal; I was able to remove the object (earring backing) successfully. TM not completely visualized due to canal irritation and bleeding. This is likely the cause of a lot of her symptoms. Will empirically cover for AOM/ruptured TM, as well as AOE. Will  give Zofran ODT and PO challenge, as long as she does well then she can be discharged with zofran rx and abx. Advised not putting anything in her ear. Upreg neg, awaiting U/A, will reassess shortly  6:01 PM U/A with gross contamination but no evidence of UTI. Pt tolerating PO well and feels improved, ready for d/c. Advised f/up with PCP in 1wk for recheck, use of tylenol/motrin for pain, and avoiding putting anything in her ear. Use of abx advised. I explained the diagnosis and have given explicit precautions to return to the ER including for any other new or worsening symptoms. The patient understands and accepts the medical plan as it's been dictated and I have answered their questions. Discharge instructions concerning home care and prescriptions have been given. The patient is STABLE and is discharged to home in good condition.    Final Clinical Impressions(s) / ED Diagnoses   Final diagnoses:  Foreign body of right ear, initial encounter  Other infective acute otitis externa of right  ear  Acute otitis media, unspecified otitis media type  Nausea and vomiting in adult patient  Intermittent headache    New Prescriptions New Prescriptions   AMOXICILLIN-CLAVULANATE (AUGMENTIN) 875-125 MG TABLET    Take 1 tablet by mouth 2 (two) times daily. One po bid x 7 days   CIPROFLOXACIN-DEXAMETHASONE (CIPRODEX) OTIC SUSPENSION    Place 4 drops into the right ear 2 (two) times daily.   ONDANSETRON (ZOFRAN ODT) 4 MG DISINTEGRATING TABLET    Take 1 tablet (4 mg total) by mouth every 8 (eight) hours as needed for nausea or vomiting.     [id number] Coner Gibbard, PA-C 09/10/16 1802    Cathren Laine, MD 09/10/16 2007

## 2016-09-10 NOTE — ED Notes (Signed)
Pt said she didn't want to wait anymore for her results. Pt was informed of the risks and implications of leaving AMA but decided to do so anyway. AMA form was signed.

## 2016-09-10 NOTE — ED Triage Notes (Signed)
Pt complains of headache, nausea emesis for the past 2 weeks. Pt's menstrual period was in February, states she has irregular periods.

## 2016-10-20 ENCOUNTER — Emergency Department (HOSPITAL_COMMUNITY)
Admission: EM | Admit: 2016-10-20 | Discharge: 2016-10-20 | Disposition: A | Discharge status: Home / Self Care | Payer: Medicaid Other | Attending: Emergency Medicine | Admitting: Emergency Medicine

## 2016-10-20 ENCOUNTER — Encounter (HOSPITAL_COMMUNITY): Payer: Self-pay | Admitting: *Deleted

## 2016-10-20 DIAGNOSIS — F17210 Nicotine dependence, cigarettes, uncomplicated: Secondary | ICD-10-CM | POA: Insufficient documentation

## 2016-10-20 DIAGNOSIS — R42 Dizziness and giddiness: Secondary | ICD-10-CM

## 2016-10-20 DIAGNOSIS — I10 Essential (primary) hypertension: Secondary | ICD-10-CM | POA: Insufficient documentation

## 2016-10-20 DIAGNOSIS — R0789 Other chest pain: Secondary | ICD-10-CM | POA: Insufficient documentation

## 2016-10-20 DIAGNOSIS — Z79899 Other long term (current) drug therapy: Secondary | ICD-10-CM | POA: Diagnosis not present

## 2016-10-20 DIAGNOSIS — H7291 Unspecified perforation of tympanic membrane, right ear: Secondary | ICD-10-CM | POA: Insufficient documentation

## 2016-10-20 LAB — CBC WITH DIFFERENTIAL/PLATELET
BASOS ABS: 0.1 10*3/uL (ref 0.0–0.1)
BASOS PCT: 1 %
EOS ABS: 0.1 10*3/uL (ref 0.0–0.7)
Eosinophils Relative: 2 %
HEMATOCRIT: 41.8 % (ref 36.0–46.0)
Hemoglobin: 14.5 g/dL (ref 12.0–15.0)
LYMPHS ABS: 2.7 10*3/uL (ref 0.7–4.0)
Lymphocytes Relative: 37 %
MCH: 28 pg (ref 26.0–34.0)
MCHC: 34.7 g/dL (ref 30.0–36.0)
MCV: 80.7 fL (ref 78.0–100.0)
MONOS PCT: 8 %
Monocytes Absolute: 0.6 10*3/uL (ref 0.1–1.0)
NEUTROS ABS: 3.9 10*3/uL (ref 1.7–7.7)
Neutrophils Relative %: 52 %
Platelets: 282 10*3/uL (ref 150–400)
RBC: 5.18 MIL/uL — ABNORMAL HIGH (ref 3.87–5.11)
RDW: 14.9 % (ref 11.5–15.5)
WBC: 7.4 10*3/uL (ref 4.0–10.5)

## 2016-10-20 LAB — I-STAT CHEM 8, ED
BUN: 10 mg/dL (ref 6–20)
CREATININE: 0.9 mg/dL (ref 0.44–1.00)
Calcium, Ion: 1.1 mmol/L — ABNORMAL LOW (ref 1.15–1.40)
Chloride: 105 mmol/L (ref 101–111)
GLUCOSE: 81 mg/dL (ref 65–99)
HCT: 45 % (ref 36.0–46.0)
HEMOGLOBIN: 15.3 g/dL — AB (ref 12.0–15.0)
Potassium: 3.8 mmol/L (ref 3.5–5.1)
Sodium: 139 mmol/L (ref 135–145)
TCO2: 24 mmol/L (ref 0–100)

## 2016-10-20 LAB — BASIC METABOLIC PANEL
Anion gap: 9 (ref 5–15)
BUN: 8 mg/dL (ref 6–20)
CALCIUM: 9.4 mg/dL (ref 8.9–10.3)
CO2: 21 mmol/L — ABNORMAL LOW (ref 22–32)
Chloride: 106 mmol/L (ref 101–111)
Creatinine, Ser: 0.97 mg/dL (ref 0.44–1.00)
GFR calc Af Amer: >60 mL/min (ref >60)
GLUCOSE: 88 mg/dL (ref 65–99)
Potassium: 4.5 mmol/L (ref 3.5–5.1)
Sodium: 136 mmol/L (ref 135–145)

## 2016-10-20 LAB — URINALYSIS, ROUTINE W REFLEX MICROSCOPIC
Bilirubin Urine: NEGATIVE
GLUCOSE, UA: NEGATIVE mg/dL
HGB URINE DIPSTICK: NEGATIVE
KETONES UR: NEGATIVE mg/dL
Leukocytes, UA: NEGATIVE
Nitrite: NEGATIVE
PROTEIN: NEGATIVE mg/dL
Specific Gravity, Urine: 1.014 (ref 1.005–1.030)
pH: 7 (ref 5.0–8.0)

## 2016-10-20 LAB — POC URINE PREG, ED: Preg Test, Ur: NEGATIVE

## 2016-10-20 NOTE — ED Notes (Signed)
Pt stable, understands discharge instructions, and reasons for return.   

## 2016-10-20 NOTE — ED Triage Notes (Signed)
Pt c/o dizziness onset for several years, pt evaluated for symptoms 2-3 months ago, denies head injury, c/o headache & weakness, A&O x4, no slurred speech, no facial droop, denies head injury, pt states, "My work said I have to come here to get checked out." A&O x4

## 2016-10-20 NOTE — ED Provider Notes (Signed)
[facility]-EMERGENCY DEPT Provider Note   CSN: [id number] Arrival date & time: 10/20/16  1145     History   Chief Complaint Chief Complaint  Patient presents with  . Dizziness    HPI Wendy Waters is a 26 y.o. female.Plains of dizziness meaning lightheadedness and sensation of room spinning onset approximately one year ago comes on when she exerts herself or is under stress or in a hot environment. Symptoms no different today. She is presently asymptomatic since being here. She denies any headache or visual changes or changes in speech and she also reports intermittent chest pain for one year. Not made better or worse by anything last had chest pain yesterday lasting 30 minutes which was improved after she stretched. No other associated symptoms. She only presents today as her employer demanded that she come to the emergency department to get "checked out  HPI  Past Medical History:  Diagnosis Date  . Fracture of foot    right  . Hypertension     There are no active problems to display for this patient.   Past Surgical History:  Procedure Laterality Date  . CESAREAN SECTION      OB History    No data available       Home Medications    Prior to Admission medications   Medication Sig Start Date End Date Taking? Authorizing Provider  acetaminophen (TYLENOL) 650 MG CR tablet Take 650 mg by mouth every 8 (eight) hours as needed for pain.   Yes [provider]  amoxicillin-clavulanate (AUGMENTIN) 875-125 MG tablet Take 1 tablet by mouth 2 (two) times daily. One po bid x 7 days Patient not taking: Reported on 10/20/2016 09/10/16   Street, Lydia, PA-C  ondansetron (ZOFRAN ODT) 4 MG disintegrating tablet Take 1 tablet (4 mg total) by mouth every 8 (eight) hours as needed for nausea or vomiting. Patient not taking: Reported on 10/20/2016 09/10/16   Street, Dund[place], PA-C    Family History No family history on file.  Social History Social History  Substance Use Topics  .  Smoking status: Current Every Day Smoker    Packs/day: 0.10    Types: Cigarettes  . Smokeless tobacco: Never Used  . Alcohol use Yes     Comment: occ     Allergies   Aleve [naproxen sodium]   Review of Systems Review of Systems  Cardiovascular: Positive for chest pain.  Genitourinary:       Irregular menses. Last normal menstrual period 2 months ago  Neurological: Positive for dizziness and light-headedness.     Physical Exam Updated Vital Signs BP 133/83   Pulse 79   Temp 98.6 F (37 C) (Oral)   Resp 20   Ht 5\' 2"  (1.575 m)   Wt 170 lb (77.1 kg)   LMP 08/21/2016 (Approximate)   SpO2 100%   BMI 31.09 kg/m   Physical Exam  Constitutional: She is oriented to person, place, and time. She appears well-developed and well-nourished. No distress.  HENT:  Head: Normocephalic and atraumatic.  Right Ear: External ear normal.  Left Ear: External ear normal.  Right ear with tympanic membrane perforated, left tympanic membrane normal  Eyes: Conjunctivae are normal. Pupils are equal, round, and reactive to light.  Neck: Neck supple. No tracheal deviation present. No thyromegaly present.  Cardiovascular: Normal rate and regular rhythm.   No murmur heard. Pulmonary/Chest: Effort normal and breath sounds normal.  Abdominal: Soft. Bowel sounds are normal. She exhibits no distension. There is no tenderness.  Obese  Musculoskeletal: Normal range of motion. She exhibits no edema or tenderness.  Neurological: She is alert and oriented to person, place, and time. She displays normal reflexes. No cranial nerve deficit. Coordination normal.  Cranial nerves II through XII grossly intact Gait normal Romberg normal pronator drift normal finger to nose normal DTR symmetric bilaterally at knee jerk ankle jerk and biceps toes downgoing bilaterally  Skin: Skin is warm and dry. No rash noted.  Psychiatric: She has a normal mood and affect.  Nursing note and vitals reviewed.    ED Treatments  / Results  Labs (all labs ordered are listed, but only abnormal results are displayed) Labs Reviewed  URINALYSIS, ROUTINE W REFLEX MICROSCOPIC - Abnormal; Notable for the following:       Result Value   APPearance HAZY (*)    All other components within normal limits  BASIC METABOLIC PANEL  CBC WITH DIFFERENTIAL/PLATELET  CBC WITH DIFFERENTIAL/PLATELET  POC URINE PREG, ED  I-STAT CHEM 8, ED  I-STAT BETA HCG BLOOD, ED ([facility], [facility], [facility] ONLY)    EKG  EKG Interpretation None     ED ECG REPORT   Date: 10/20/2016  Rate: 58  Rhythm: sinus bradycardia  QRS Axis: normal  Intervals: normal  ST/T Wave abnormalities: normal  Conduction Disutrbances:nonspecific intraventricular conduction delay  Narrative Interpretation:   Old EKG Reviewed: none available  I have personally reviewed the EKG tracing and agree with the computerized printout as noted. Results for orders placed or performed during the hospital encounter of 10/20/16  Basic metabolic panel  Result Value Ref Range   Sodium 136 135 - 145 mmol/L   Potassium 4.5 3.5 - 5.1 mmol/L   Chloride 106 101 - 111 mmol/L   CO2 21 (L) 22 - 32 mmol/L   Glucose, Bld 88 65 - 99 mg/dL   BUN 8 6 - 20 mg/dL   Creatinine, Ser 1.61 0.44 - 1.00 mg/dL   Calcium 9.4 8.9 - 09.6 mg/dL   GFR calc non Af Amer >60 >60 mL/min   GFR calc Af Amer >60 >60 mL/min   Anion gap 9 5 - 15  Urinalysis, Routine w reflex microscopic  Result Value Ref Range   Color, Urine YELLOW YELLOW   APPearance HAZY (A) CLEAR   Specific Gravity, Urine 1.014 1.005 - 1.030   pH 7.0 5.0 - 8.0   Glucose, UA NEGATIVE NEGATIVE mg/dL   Hgb urine dipstick NEGATIVE NEGATIVE   Bilirubin Urine NEGATIVE NEGATIVE   Ketones, ur NEGATIVE NEGATIVE mg/dL   Protein, ur NEGATIVE NEGATIVE mg/dL   Nitrite NEGATIVE NEGATIVE   Leukocytes, UA NEGATIVE NEGATIVE  POC Urine Pregnancy, ED (do NOT order at [facility])  Result Value Ref Range   Preg Test, Ur NEGATIVE NEGATIVE  I-stat chem 8, ed  Result  Value Ref Range   Sodium 139 135 - 145 mmol/L   Potassium 3.8 3.5 - 5.1 mmol/L   Chloride 105 101 - 111 mmol/L   BUN 10 6 - 20 mg/dL   Creatinine, Ser 0.45 0.44 - 1.00 mg/dL   Glucose, Bld 81 65 - 99 mg/dL   Calcium, Ion 4.09 (L) 1.15 - 1.40 mmol/L   TCO2 24 0 - 100 mmol/L   Hemoglobin 15.3 (H) 12.0 - 15.0 g/dL   HCT 81.1 91.4 - 78.2 %   No results found. Radiology No results found.  Procedures Procedures (including critical care time)  Medications Ordered in ED Medications - No data to display  Results for orders placed  or performed during the hospital encounter of 10/20/16  Basic metabolic panel  Result Value Ref Range   Sodium 136 135 - 145 mmol/L   Potassium 4.5 3.5 - 5.1 mmol/L   Chloride 106 101 - 111 mmol/L   CO2 21 (L) 22 - 32 mmol/L   Glucose, Bld 88 65 - 99 mg/dL   BUN 8 6 - 20 mg/dL   Creatinine, Ser 1.190.97 0.44 - 1.00 mg/dL   Calcium 9.4 8.9 - 14.710.3 mg/dL   GFR calc non Af Amer >60 >60 mL/min   GFR calc Af Amer >60 >60 mL/min   Anion gap 9 5 - 15  Urinalysis, Routine w reflex microscopic  Result Value Ref Range   Color, Urine YELLOW YELLOW   APPearance HAZY (A) CLEAR   Specific Gravity, Urine 1.014 1.005 - 1.030   pH 7.0 5.0 - 8.0   Glucose, UA NEGATIVE NEGATIVE mg/dL   Hgb urine dipstick NEGATIVE NEGATIVE   Bilirubin Urine NEGATIVE NEGATIVE   Ketones, ur NEGATIVE NEGATIVE mg/dL   Protein, ur NEGATIVE NEGATIVE mg/dL   Nitrite NEGATIVE NEGATIVE   Leukocytes, UA NEGATIVE NEGATIVE  POC Urine Pregnancy, ED ([facility] NOT [facility])  Result Value Ref Range   Preg Test, Ur NEGATIVE NEGATIVE  I-stat chem 8, ed  Result Value Ref Range   Sodium 139 135 - 145 mmol/L   Potassium 3.8 3.5 - 5.1 mmol/L   Chloride 105 101 - 111 mmol/L   BUN 10 6 - 20 mg/dL   Creatinine, Ser 8.290.90 0.44 - 1.00 mg/dL   Glucose, Bld 81 65 - 99 mg/dL   Calcium, Ion 5.621.10 (L) 1.15 - 1.40 mmol/L   TCO2 24 0 - 100 mmol/L   Hemoglobin 15.3 (H) 12.0 - 15.0 g/dL   HCT 13.045.0 86.536.0 - 78.446.0 %   No  results found. Initial Impression / Assessment and Plan / ED Course  I have reviewed the triage vital signs and the nursing notes.  Pertinent labs & imaging results that were available during my care of the patient were reviewed by me and considered in my medical decision making (see chart for details).     All patient's symptoms are chronic she is noted to have perforated right tympanic membrane. I will refer to ENT as well as primary care   Final Clinical Impressions(s) / ED Diagnoses  Diagnosis #1 chronic dizziness #2 atypical chest pain #3 perforated right tympanic membrane Final diagnoses:  None    New Prescriptions New Prescriptions   No medications on file     Doug SouJacubowitz, Cylah Fannin, MD 10/20/16 1635

## 2016-10-20 NOTE — Discharge Instructions (Signed)
Your exam shows that you have a broken eardrum on the right side. Call [facility] at [phone] to evaluate you further for your broken eardrum which may be contributing to your dizziness. Your scheduled appointment at the clinic on May 22 at 1:45 PM

## 2016-10-20 NOTE — ED Notes (Addendum)
Pt reports headache and dizziness that can start in either order or together and sometimes leads to N/V and CP.  Pt states this has been going on for months and [facility] and given medication for her headache and nausea but states that wouldn't help her at home.  This RN asked if pt was told to follow up with anyone, like neurology, and pt stated she was not told to do so.  Pt states when she presses her temples it makes her head hurt more and causes the dizziness.  Pt reports she was sitting in the lobby and "it came on and I almost went out."  Pt denies pain at this time, ambulatory to the rest room.

## 2016-10-26 ENCOUNTER — Ambulatory Visit: Payer: Medicaid Other | Admitting: Family Medicine

## 2019-02-06 ENCOUNTER — Encounter (HOSPITAL_COMMUNITY): Payer: Self-pay | Admitting: Emergency Medicine

## 2019-02-06 ENCOUNTER — Other Ambulatory Visit: Payer: Self-pay

## 2019-02-06 ENCOUNTER — Emergency Department (HOSPITAL_COMMUNITY)
Admission: EM | Admit: 2019-02-06 | Discharge: 2019-02-06 | Disposition: A | Discharge status: Home / Self Care | Payer: Medicaid Other | Attending: Emergency Medicine | Admitting: Emergency Medicine

## 2019-02-06 DIAGNOSIS — J020 Streptococcal pharyngitis: Secondary | ICD-10-CM | POA: Insufficient documentation

## 2019-02-06 DIAGNOSIS — J029 Acute pharyngitis, unspecified: Secondary | ICD-10-CM | POA: Diagnosis present

## 2019-02-06 DIAGNOSIS — F17210 Nicotine dependence, cigarettes, uncomplicated: Secondary | ICD-10-CM | POA: Diagnosis not present

## 2019-02-06 DIAGNOSIS — I10 Essential (primary) hypertension: Secondary | ICD-10-CM | POA: Diagnosis not present

## 2019-02-06 LAB — POC URINE PREG, ED: Preg Test, Ur: NEGATIVE

## 2019-02-06 LAB — GROUP A STREP BY PCR: Group A Strep by PCR: DETECTED — AB

## 2019-02-06 MED ORDER — ACETAMINOPHEN 500 MG PO TABS
1000.0000 mg | ORAL_TABLET | Freq: Once | ORAL | Status: AC
  Administered 2019-02-06: 1000 mg via ORAL
  Filled 2019-02-06: qty 2

## 2019-02-06 MED ORDER — PENICILLIN G BENZATHINE 1200000 UNIT/2ML IM SUSP
1.2000 10*6.[IU] | Freq: Once | INTRAMUSCULAR | Status: AC
  Administered 2019-02-06: 21:00:00 1.2 10*6.[IU] via INTRAMUSCULAR
  Filled 2019-02-06: qty 2

## 2019-02-06 NOTE — Discharge Instructions (Signed)
You are given a dose of penicillin here in the emergency room.  That should treat your strep throat infection.  Continue to take Tylenol help with your fever and body aches

## 2019-02-06 NOTE — ED Triage Notes (Signed)
Pt c/o sore throat, right ear, body aches, chills that started today.

## 2019-02-06 NOTE — ED Provider Notes (Signed)
[facility]-EMERGENCY DEPT Provider Note   CSN: [id number] Arrival date & time: 02/06/19  1851     History   Chief Complaint Chief Complaint  Patient presents with  . Sore Throat  . Otalgia    HPI Wendy Waters is a 28 y.o. female.     HPI Patient presents to the emergency room for evaluation of fever, earache and sore throat.  Patient states she suddenly started having symptoms today.  She began having a bad sore throat as well as an earache.  Patient states her lymph nodes are swollen and hurts to swallow.  She has had diffuse myalgias and chills.  He has had a slight cough but nothing significant.  She denies any shortness of breath.  Patient denies any COVID-19 exposure. Past Medical History:  Diagnosis Date  . Fracture of foot    right  . Hypertension     There are no active problems to display for this patient.   Past Surgical History:  Procedure Laterality Date  . CESAREAN SECTION       OB History   No obstetric history on file.      Home Medications    Prior to Admission medications   Medication Sig Start Date End Date Taking? Authorizing Provider  acetaminophen (TYLENOL) 650 MG CR tablet Take 650 mg by mouth every 8 (eight) hours as needed for pain.    [provider]  amoxicillin-clavulanate (AUGMENTIN) 875-125 MG tablet Take 1 tablet by mouth 2 (two) times daily. One po bid x 7 days Patient not taking: Reported on 10/20/2016 09/10/16   Street, [place], PA-C  ondansetron (ZOFRAN ODT) 4 MG disintegrating tablet Take 1 tablet (4 mg total) by mouth every 8 (eight) hours as needed for nausea or vomiting. Patient not taking: Reported on 10/20/2016 09/10/16   Street, [place], PA-C    Family History No family history on file.  Social History Social History   Tobacco Use  . Smoking status: Current Every Day Smoker    Packs/day: 0.10    Types: Cigarettes  . Smokeless tobacco: Never Used  Substance Use Topics  . Alcohol use: Yes     Comment: occ  . Drug use: No     Allergies   Aleve [naproxen sodium]   Review of Systems Review of Systems  All other systems reviewed and are negative.    Physical Exam Updated Vital Signs BP (!) 154/93   Pulse (!) 107   Temp (!) 100.4 F (38 C) (Oral)   Resp 17   SpO2 99%   Physical Exam Vitals signs and nursing note reviewed.  Constitutional:      Appearance: She is well-developed. She is not diaphoretic.  HENT:     Head: Normocephalic and atraumatic.     Right Ear: Tympanic membrane and external ear normal.     Left Ear: Tympanic membrane and external ear normal.     Nose: No congestion.     Mouth/Throat:     Pharynx: Pharyngeal swelling and posterior oropharyngeal erythema present.     Tonsils: No tonsillar exudate or tonsillar abscesses. 2+ on the right. 2+ on the left.     Comments: Tonsillar erythema and edema bilaterally Eyes:     General: No scleral icterus.       Right eye: No discharge.        Left eye: No discharge.     Conjunctiva/sclera: Conjunctivae normal.  Neck:     Musculoskeletal: Neck supple.  Trachea: No tracheal deviation.  Cardiovascular:     Rate and Rhythm: Normal rate and regular rhythm.  Pulmonary:     Effort: Pulmonary effort is normal. No respiratory distress.     Breath sounds: Normal breath sounds. No stridor. No wheezing or rales.  Abdominal:     General: Bowel sounds are normal. There is no distension.     Palpations: Abdomen is soft.     Tenderness: There is no abdominal tenderness. There is no guarding or rebound.  Musculoskeletal:        General: No tenderness.  Skin:    General: Skin is warm and dry.     Findings: No rash.  Neurological:     Mental Status: She is alert.     Cranial Nerves: No cranial nerve deficit (no facial droop, extraocular movements intact, no slurred speech).     Sensory: No sensory deficit.     Motor: No abnormal muscle tone or seizure activity.     Coordination: Coordination normal.       ED Treatments / Results  Labs (all labs ordered are listed, but only abnormal results are displayed) Labs Reviewed  GROUP A STREP BY PCR - Abnormal; Notable for the following components:      Result Value   Group A Strep by PCR DETECTED (*)    All other components within normal limits  POC URINE PREG, ED    EKG None  Radiology No results found.  Procedures Procedures (including critical care time)  Medications Ordered in ED Medications  acetaminophen (TYLENOL) tablet 1,000 mg (1,000 mg Oral Given 02/06/19 1949)  penicillin g benzathine (BICILLIN LA) 1200000 UNIT/2ML injection 1.2 Million Units (1.2 Million Units Intramuscular Given 02/06/19 2103)     Initial Impression / Assessment and Plan / ED Course  I have reviewed the triage vital signs and the nursing notes.  Pertinent labs & imaging results that were available during my care of the patient were reviewed by me and considered in my medical decision making (see chart for details).  Clinical Course as of Feb 05 2105  Tue Feb 06, 2019  1937 Discussed COVID-19 with patient.  She would prefer not to do the test right now.  We will start with strep test.  If that is negative then she will then agree to have a covid test.   [JK]    Clinical Course User Index [JK] Dorie Rank, MD     Patient strep test was positive.  She is not have any difficulty swallowing her secretions.  No evidence of abscess.  Patient opted for a penicillin injection.  Plan on discharge home.  Discussed Tylenol and supportive care.  Final Clinical Impressions(s) / ED Diagnoses   Final diagnoses:  Strep pharyngitis    ED Discharge Orders    None       Dorie Rank, MD 02/06/19 2107

## 2019-12-26 ENCOUNTER — Emergency Department (HOSPITAL_COMMUNITY): Payer: Medicaid Other

## 2019-12-26 ENCOUNTER — Encounter (HOSPITAL_COMMUNITY): Payer: Self-pay

## 2019-12-26 ENCOUNTER — Emergency Department (HOSPITAL_COMMUNITY)
Admission: EM | Admit: 2019-12-26 | Discharge: 2019-12-26 | Disposition: A | Discharge status: Home / Self Care | Payer: Medicaid Other | Attending: Emergency Medicine | Admitting: Emergency Medicine

## 2019-12-26 DIAGNOSIS — F419 Anxiety disorder, unspecified: Secondary | ICD-10-CM | POA: Insufficient documentation

## 2019-12-26 DIAGNOSIS — Z5321 Procedure and treatment not carried out due to patient leaving prior to being seen by health care provider: Secondary | ICD-10-CM | POA: Diagnosis not present

## 2019-12-26 DIAGNOSIS — R519 Headache, unspecified: Secondary | ICD-10-CM | POA: Diagnosis not present

## 2019-12-26 LAB — I-STAT BETA HCG BLOOD, ED (MC, WL, AP ONLY): I-stat hCG, quantitative: <5 m[IU]/mL (ref <5)

## 2019-12-26 LAB — BASIC METABOLIC PANEL
Anion gap: 11 (ref 5–15)
BUN: 8 mg/dL (ref 6–20)
CO2: 25 mmol/L (ref 22–32)
Calcium: 9.5 mg/dL (ref 8.9–10.3)
Chloride: 107 mmol/L (ref 98–111)
Creatinine, Ser: 1.04 mg/dL — ABNORMAL HIGH (ref 0.44–1.00)
GFR calc Af Amer: >60 mL/min (ref >60)
GFR calc non Af Amer: >60 mL/min (ref >60)
Glucose, Bld: 121 mg/dL — ABNORMAL HIGH (ref 70–99)
Potassium: 3.9 mmol/L (ref 3.5–5.1)
Sodium: 143 mmol/L (ref 135–145)

## 2019-12-26 LAB — TROPONIN I (HIGH SENSITIVITY)
Troponin I (High Sensitivity): 5 ng/L (ref <18)
Troponin I (High Sensitivity): 4 ng/L (ref <18)

## 2019-12-26 LAB — CBC
HCT: 43.6 % (ref 36.0–46.0)
Hemoglobin: 14.1 g/dL (ref 12.0–15.0)
MCH: 27 pg (ref 26.0–34.0)
MCHC: 32.3 g/dL (ref 30.0–36.0)
MCV: 83.5 fL (ref 80.0–100.0)
Platelets: 392 10*3/uL (ref 150–400)
RBC: 5.22 MIL/uL — ABNORMAL HIGH (ref 3.87–5.11)
RDW: 15.3 % (ref 11.5–15.5)
WBC: 8.2 10*3/uL (ref 4.0–10.5)
nRBC: 0 % (ref 0.0–0.2)

## 2019-12-26 MED ORDER — ACETAMINOPHEN 325 MG PO TABS
650.0000 mg | ORAL_TABLET | Freq: Once | ORAL | Status: AC
  Administered 2019-12-26: 21:00:00 650 mg via ORAL
  Filled 2019-12-26: qty 2

## 2019-12-26 NOTE — ED Notes (Signed)
Pt asked about wait time and stated that she will not be waiting any longer. She will seek care elsewhere or go home. Pt ambulated out of ED.

## 2019-12-26 NOTE — ED Triage Notes (Signed)
Pt arrives POV for eval of shortness of breath and anxiety. Pt reports she woke up and felt anxious this AM. States she has been taking vistaril and felt that it has been making it worse.

## 2019-12-28 ENCOUNTER — Emergency Department (HOSPITAL_COMMUNITY)
Admission: EM | Admit: 2019-12-28 | Discharge: 2019-12-28 | Disposition: A | Discharge status: Home / Self Care | Payer: Medicaid Other | Attending: Emergency Medicine | Admitting: Emergency Medicine

## 2019-12-28 ENCOUNTER — Other Ambulatory Visit: Payer: Self-pay

## 2019-12-28 ENCOUNTER — Encounter (HOSPITAL_COMMUNITY): Payer: Self-pay | Admitting: Emergency Medicine

## 2019-12-28 DIAGNOSIS — I10 Essential (primary) hypertension: Secondary | ICD-10-CM | POA: Diagnosis not present

## 2019-12-28 DIAGNOSIS — F17210 Nicotine dependence, cigarettes, uncomplicated: Secondary | ICD-10-CM | POA: Diagnosis not present

## 2019-12-28 DIAGNOSIS — R0789 Other chest pain: Secondary | ICD-10-CM | POA: Diagnosis present

## 2019-12-28 DIAGNOSIS — E876 Hypokalemia: Secondary | ICD-10-CM

## 2019-12-28 DIAGNOSIS — R072 Precordial pain: Secondary | ICD-10-CM

## 2019-12-28 DIAGNOSIS — F419 Anxiety disorder, unspecified: Secondary | ICD-10-CM | POA: Diagnosis not present

## 2019-12-28 LAB — BASIC METABOLIC PANEL
Anion gap: 11 (ref 5–15)
BUN: 12 mg/dL (ref 6–20)
CO2: 24 mmol/L (ref 22–32)
Calcium: 9.3 mg/dL (ref 8.9–10.3)
Chloride: 104 mmol/L (ref 98–111)
Creatinine, Ser: 0.98 mg/dL (ref 0.44–1.00)
GFR calc Af Amer: >60 mL/min (ref >60)
GFR calc non Af Amer: >60 mL/min (ref >60)
Glucose, Bld: 125 mg/dL — ABNORMAL HIGH (ref 70–99)
Potassium: 3.3 mmol/L — ABNORMAL LOW (ref 3.5–5.1)
Sodium: 139 mmol/L (ref 135–145)

## 2019-12-28 LAB — TROPONIN I (HIGH SENSITIVITY)
Troponin I (High Sensitivity): 6 ng/L (ref <18)
Troponin I (High Sensitivity): 6 ng/L (ref <18)

## 2019-12-28 LAB — CBC
HCT: 38.2 % (ref 36.0–46.0)
Hemoglobin: 12.6 g/dL (ref 12.0–15.0)
MCH: 26.9 pg (ref 26.0–34.0)
MCHC: 33 g/dL (ref 30.0–36.0)
MCV: 81.6 fL (ref 80.0–100.0)
Platelets: 364 10*3/uL (ref 150–400)
RBC: 4.68 MIL/uL (ref 3.87–5.11)
RDW: 15 % (ref 11.5–15.5)
WBC: 8.6 10*3/uL (ref 4.0–10.5)
nRBC: 0 % (ref 0.0–0.2)

## 2019-12-28 MED ORDER — POTASSIUM CHLORIDE CRYS ER 20 MEQ PO TBCR
40.0000 meq | EXTENDED_RELEASE_TABLET | Freq: Once | ORAL | Status: AC
  Administered 2019-12-28: 40 meq via ORAL
  Filled 2019-12-28: qty 2

## 2019-12-28 MED ORDER — FAMOTIDINE 20 MG PO TABS
20.0000 mg | ORAL_TABLET | Freq: Once | ORAL | Status: AC
  Administered 2019-12-28: 20 mg via ORAL
  Filled 2019-12-28: qty 1

## 2019-12-28 MED ORDER — LORAZEPAM 1 MG PO TABS
0.5000 mg | ORAL_TABLET | Freq: Once | ORAL | Status: AC
  Administered 2019-12-28: 0.5 mg via ORAL
  Filled 2019-12-28: qty 1

## 2019-12-28 MED ORDER — ALUM & MAG HYDROXIDE-SIMETH 200-200-20 MG/5ML PO SUSP
30.0000 mL | Freq: Once | ORAL | Status: AC
  Administered 2019-12-28: 30 mL via ORAL
  Filled 2019-12-28: qty 30

## 2019-12-28 MED ORDER — SODIUM CHLORIDE 0.9% FLUSH: 3.0000 mL | Freq: Once | INTRAVENOUS | Status: DC

## 2019-12-28 NOTE — ED Provider Notes (Signed)
[facility] EMERGENCY DEPARTMENT Provider Note   CSN: [id number] Arrival date & time: 12/28/19  0102     History Chief Complaint  Patient presents with  . Chest Pain  . Anxiety    Shaunae Sieloff is a 29 y.o. female.  Patient presents with intermittent chest pain for past few weeks. States occurs at rest, usually at night, 'when trying to relax'. Describes acute onset dull pain, lower sternal area/midline, non radiating. No constant and/or pleuritic chest pain. No associated nv, diaphoresis or sob. Denies cough or uri symptoms. No fever or chills. Denies heartburn. No leg pain or swelling. No personal or fam hx premature cad. No hx dvt or pe. Denies hx gerd. No hx chest wall strain. States occasionally if she raises her breasts up, it will feel better. Lasts several minutes/hours. No exertional cp or unusual doe. States also hx anxiety, and often associated w anxiety/stress. Tried vistaril without relief. States was in ED yesterday with same but left due to wait.   The history is provided by the patient.  Chest Pain Associated symptoms: anxiety   Associated symptoms: no abdominal pain, no back pain, no cough, no fever, no headache, no palpitations, no shortness of breath and no vomiting   Anxiety Associated symptoms include chest pain. Pertinent negatives include no abdominal pain, no headaches and no shortness of breath.       Past Medical History:  Diagnosis Date  . Fracture of foot    right  . Hypertension     There are no problems to display for this patient.   Past Surgical History:  Procedure Laterality Date  . CESAREAN SECTION       OB History   No obstetric history on file.     No family history on file.  Social History   Tobacco Use  . Smoking status: Current Every Day Smoker    Packs/day: 0.10    Types: Cigarettes  . Smokeless tobacco: Never Used  Substance Use Topics  . Alcohol use: Yes    Comment: occ  . Drug use: No    Home  Medications Prior to Admission medications   Medication Sig Start Date End Date Taking? Authorizing Provider  acetaminophen (TYLENOL) 650 MG CR tablet Take 650 mg by mouth every 8 (eight) hours as needed for pain.    [provider]  amoxicillin-clavulanate (AUGMENTIN) 875-125 MG tablet Take 1 tablet by mouth 2 (two) times daily. One po bid x 7 days Patient not taking: Reported on 10/20/2016 09/10/16   Street, [place], PA-C  ondansetron (ZOFRAN ODT) 4 MG disintegrating tablet Take 1 tablet (4 mg total) by mouth every 8 (eight) hours as needed for nausea or vomiting. Patient not taking: Reported on 10/20/2016 09/10/16   Street, [place], PA-C    Allergies    Aleve [naproxen sodium]  Review of Systems   Review of Systems  Constitutional: Negative for fever.  HENT: Negative for sore throat.   Eyes: Negative for redness.  Respiratory: Negative for cough and shortness of breath.   Cardiovascular: Positive for chest pain. Negative for palpitations and leg swelling.  Gastrointestinal: Negative for abdominal pain and vomiting.  Genitourinary: Negative for flank pain.  Musculoskeletal: Negative for back pain and neck pain.  Skin: Negative for rash.  Neurological: Negative for headaches.  Hematological: Does not bruise/bleed easily.  Psychiatric/Behavioral: The patient is nervous/anxious.     Physical Exam Updated Vital Signs BP (!) 135/87   Pulse 70   Temp 98.4 F (  36.9 C) (Oral)   Resp 17   Ht 1.575 m (5\' 2" )   Wt 77 kg   SpO2 100%   BMI 31.05 kg/m   Physical Exam Vitals and nursing note reviewed.  Constitutional:      Appearance: Normal appearance. She is well-developed.  HENT:     Head: Atraumatic.     Nose: Nose normal.     Mouth/Throat:     Mouth: Mucous membranes are moist.  Eyes:     General: No scleral icterus.    Conjunctiva/sclera: Conjunctivae normal.     Pupils: Pupils are equal, round, and reactive to light.  Neck:     Trachea: No tracheal deviation.    Cardiovascular:     Rate and Rhythm: Normal rate and regular rhythm.     Pulses: Normal pulses.     Heart sounds: Normal heart sounds. No murmur heard.  No friction rub. No gallop.   Pulmonary:     Effort: Pulmonary effort is normal. No respiratory distress.     Breath sounds: Normal breath sounds.  Chest:     Chest wall: No tenderness.  Abdominal:     General: Bowel sounds are normal. There is no distension.     Palpations: Abdomen is soft.     Tenderness: There is no abdominal tenderness. There is no guarding.  Genitourinary:    Comments: No cva tenderness.  Musculoskeletal:        General: No swelling or tenderness.     Cervical back: Normal range of motion and neck supple. No rigidity. No muscular tenderness.     Right lower leg: No edema.     Left lower leg: No edema.  Skin:    General: Skin is warm and dry.     Findings: No rash.  Neurological:     Mental Status: She is alert.     Comments: Alert, speech normal. Motor/sens grossly intact bil, steady gait.   Psychiatric:     Comments: Mildly anxious.      ED Results / Procedures / Treatments   Labs (all labs ordered are listed, but only abnormal results are displayed) Results for orders placed or performed during the hospital encounter of 12/28/19  Basic metabolic panel  Result Value Ref Range   Sodium 139 135 - 145 mmol/L   Potassium 3.3 (L) 3.5 - 5.1 mmol/L   Chloride 104 98 - 111 mmol/L   CO2 24 22 - 32 mmol/L   Glucose, Bld 125 (H) 70 - 99 mg/dL   BUN 12 6 - 20 mg/dL   Creatinine, Ser 12/30/19 0.44 - 1.00 mg/dL   Calcium 9.3 8.9 - 4.01 mg/dL   GFR calc non Af Amer >60 >60 mL/min   GFR calc Af Amer >60 >60 mL/min   Anion gap 11 5 - 15  CBC  Result Value Ref Range   WBC 8.6 4.0 - 10.5 K/uL   RBC 4.68 3.87 - 5.11 MIL/uL   Hemoglobin 12.6 12.0 - 15.0 g/dL   HCT 02.7 36 - 46 %   MCV 81.6 80.0 - 100.0 fL   MCH 26.9 26.0 - 34.0 pg   MCHC 33.0 30.0 - 36.0 g/dL   RDW 25.3 66.4 - 40.3 %   Platelets 364 150 -  400 K/uL   nRBC 0.0 0.0 - 0.2 %  Troponin I (High Sensitivity)  Result Value Ref Range   Troponin I (High Sensitivity) 6 <18 ng/L  Troponin I (High Sensitivity)  Result Value Ref Range  Troponin I (High Sensitivity) 6 <18 ng/L   DG Chest 2 View  Result Date: 12/26/2019 CLINICAL DATA:  Chest pain. Additional provided: Patient reports shortness of breath and anxiety. EXAM: CHEST - 2 VIEW COMPARISON:  Prior chest radiograph 11/18/2014 and earlier FINDINGS: Heart size within normal limits. There is no appreciable airspace consolidation. No evidence of pleural effusion or pneumothorax. No acute bony abnormality identified. IMPRESSION: No evidence of active cardiopulmonary disease. Electronically Signed   By: Jackey Loge DO   On: 12/26/2019 16:13    EKG EKG Interpretation  Date/Time:  Friday December 28 2019 01:13:55 EDT Ventricular Rate:  89 PR Interval:  166 QRS Duration: 86 QT Interval:  364 QTC Calculation: 442 R Axis:   64 Text Interpretation: Normal sinus rhythm Nonspecific T wave abnormality Confirmed by Cathren Laine ([id number]) on 12/28/2019 10:35:57 AM   Radiology DG Chest 2 View  Result Date: 12/26/2019 CLINICAL DATA:  Chest pain. Additional provided: Patient reports shortness of breath and anxiety. EXAM: CHEST - 2 VIEW COMPARISON:  Prior chest radiograph 11/18/2014 and earlier FINDINGS: Heart size within normal limits. There is no appreciable airspace consolidation. No evidence of pleural effusion or pneumothorax. No acute bony abnormality identified. IMPRESSION: No evidence of active cardiopulmonary disease. Electronically Signed   By: Jackey Loge DO   On: 12/26/2019 16:13    Procedures Procedures (including critical care time)  Medications Ordered in ED Medications  sodium chloride flush (NS) 0.9 % injection 3 mL (3 mLs Intravenous Not Given 12/28/19 1052)  alum & mag hydroxide-simeth (MAALOX/MYLANTA) 200-200-20 MG/5ML suspension 30 mL (has no administration in time range)   famotidine (PEPCID) tablet 20 mg (has no administration in time range)  LORazepam (ATIVAN) tablet 0.5 mg (has no administration in time range)  potassium chloride SA (KLOR-CON) CR tablet 40 mEq (40 mEq Oral Given 12/28/19 1051)    ED Course  I have reviewed the triage vital signs and the nursing notes.  Pertinent labs & imaging results that were available during my care of the patient were reviewed by me and considered in my medical decision making (see chart for details).    MDM Rules/Calculators/A&P                          Labs sent. Ecg.   Reviewed nursing notes and prior charts for additional history.  From 7/21 ED visit, trop x 2 normal, cxr neg.   Labs reviewed/interpreted by me - trop x 2 normal (and two troponins from 2 days ago also normal).   Symptoms/eval felt not c/w ACS. Additional labs reviewed/interpreted by me - k sl low. kcl po.  Will try ativan .5 mg po, pepcid and maalox for symptom relief.  CXR 7/21 reviewed/interpreted by me - no pna.   Pt current appears stable for d/c.   Rec pcp f/u.  Return precautions provided.   Final Clinical Impression(s) / ED Diagnoses Final diagnoses:  None    Rx / DC Orders ED Discharge Orders    None       Cathren Laine, MD 12/28/19 1136

## 2019-12-28 NOTE — Discharge Instructions (Addendum)
It was our pleasure to provide your ER care today - we hope that you feel better.  Your heart tests look good/normal.   From today's labs, your potassium level is slightly low (3.3) - eat plenty of fruits and vegetables, and follow up with primary care doctor in the next couple week.  You may try acetaminophen or pepcid/maalox for symptom relief.  Follow up with primary care doctor in the next couple weeks.   Return to ER if worse, new symptoms, fevers, increased trouble breathing, recurrent/persistent chest pain, or other concern.   You were given medication in the ER that causes drowsiness - no driving for the next 6 hours.

## 2019-12-28 NOTE — ED Triage Notes (Signed)
Per EMS, pt c/o center of chest pressure X2 hours.  Pt reports same symptoms as yesterday, left w/o being seen.  Given 324ASA   149/97 HR 93 100% RA RR 16 CBG 133.    20G in L AC

## 2021-03-29 ENCOUNTER — Encounter (HOSPITAL_COMMUNITY): Payer: Self-pay | Admitting: Emergency Medicine

## 2021-03-29 ENCOUNTER — Emergency Department (HOSPITAL_COMMUNITY)
Admission: EM | Admit: 2021-03-29 | Discharge: 2021-03-30 | Disposition: A | Discharge status: Home / Self Care | Payer: Medicaid Other | Attending: Emergency Medicine | Admitting: Emergency Medicine

## 2021-03-29 ENCOUNTER — Other Ambulatory Visit: Payer: Self-pay

## 2021-03-29 DIAGNOSIS — F410 Panic disorder [episodic paroxysmal anxiety] without agoraphobia: Secondary | ICD-10-CM | POA: Diagnosis not present

## 2021-03-29 DIAGNOSIS — N9489 Other specified conditions associated with female genital organs and menstrual cycle: Secondary | ICD-10-CM | POA: Insufficient documentation

## 2021-03-29 DIAGNOSIS — I10 Essential (primary) hypertension: Secondary | ICD-10-CM | POA: Diagnosis not present

## 2021-03-29 DIAGNOSIS — F17210 Nicotine dependence, cigarettes, uncomplicated: Secondary | ICD-10-CM | POA: Diagnosis not present

## 2021-03-29 LAB — BASIC METABOLIC PANEL
Anion gap: 9 (ref 5–15)
BUN: 10 mg/dL (ref 6–20)
CO2: 26 mmol/L (ref 22–32)
Calcium: 9.3 mg/dL (ref 8.9–10.3)
Chloride: 105 mmol/L (ref 98–111)
Creatinine, Ser: 1.14 mg/dL — ABNORMAL HIGH (ref 0.44–1.00)
GFR, Estimated: >60 mL/min (ref >60)
Glucose, Bld: 118 mg/dL — ABNORMAL HIGH (ref 70–99)
Potassium: 4.1 mmol/L (ref 3.5–5.1)
Sodium: 140 mmol/L (ref 135–145)

## 2021-03-29 LAB — CBC WITH DIFFERENTIAL/PLATELET
Abs Immature Granulocytes: 0.04 10*3/uL (ref 0.00–0.07)
Basophils Absolute: 0 10*3/uL (ref 0.0–0.1)
Basophils Relative: 0 %
Eosinophils Absolute: 0.1 10*3/uL (ref 0.0–0.5)
Eosinophils Relative: 1 %
HCT: 42.4 % (ref 36.0–46.0)
Hemoglobin: 13.9 g/dL (ref 12.0–15.0)
Immature Granulocytes: 0 %
Lymphocytes Relative: 18 %
Lymphs Abs: 1.8 10*3/uL (ref 0.7–4.0)
MCH: 26.8 pg (ref 26.0–34.0)
MCHC: 32.8 g/dL (ref 30.0–36.0)
MCV: 81.7 fL (ref 80.0–100.0)
Monocytes Absolute: 0.7 10*3/uL (ref 0.1–1.0)
Monocytes Relative: 7 %
Neutro Abs: 7.3 10*3/uL (ref 1.7–7.7)
Neutrophils Relative %: 74 %
Platelets: 365 10*3/uL (ref 150–400)
RBC: 5.19 MIL/uL — ABNORMAL HIGH (ref 3.87–5.11)
RDW: 15.1 % (ref 11.5–15.5)
WBC: 10 10*3/uL (ref 4.0–10.5)
nRBC: 0 % (ref 0.0–0.2)

## 2021-03-29 LAB — I-STAT BETA HCG BLOOD, ED (MC, WL, AP ONLY): I-stat hCG, quantitative: <5 m[IU]/mL (ref <5)

## 2021-03-29 NOTE — ED Provider Notes (Signed)
Emergency Medicine Provider Triage Evaluation Note  Wendy Waters , a 30 y.o. female  was evaluated in triage.  Pt complains of increased anxiety and dizziness.  Patient reports that she has had recently worsening anxiety.  Patient is not seeing any psychiatrist and does not take any medication for this.  Patient denies any SI, HI, AVH.  Patient reports that today she started developing dizziness whenever she attempted to go to sleep.  Feels like entire room is spinning and she is unable to stop it.  Review of Systems  Positive: Dizziness, anxiety Negative: SI, HI, AVH, chest pain, shortness of breath, numbness, weakness  Physical Exam  BP (!) 133/94 (BP Location: Right Arm)   Pulse 91   Temp 98.3 F (36.8 C) (Oral)   Resp 20   SpO2 99%  Gen:   Awake, no distress   Resp:  Normal effort  MSK:   Moves extremities without difficulty  Other:  CN II through XII intact.  +5 strength to bilateral upper and lower extremities.  Pronator drift negative.  Sensation to light touch intact to bilateral upper and lower extremities.  Medical Decision Making  Medically screening exam initiated at 9:57 PM.  Appropriate orders placed.  Wendy Waters was informed that the remainder of the evaluation will be completed by another provider, this initial triage assessment does not replace that evaluation, and the importance of remaining in the ED until their evaluation is complete.  Due to patient's dizziness will obtain EKG and basic lab work.   Wendy Waters 03/29/21 2158    Eber Hong, MD 03/30/21 2023

## 2021-03-29 NOTE — ED Triage Notes (Signed)
Per EMS, pt from home called out for "an anxiety attack."  Pt has hx but states it is worse than normal and she could not make it stop.    BP 148/94 HR 94 RR 20 100% RA CBG 116

## 2021-03-30 MED ORDER — LORAZEPAM 1 MG PO TABS
1.0000 mg | ORAL_TABLET | Freq: Once | ORAL | Status: AC
  Administered 2021-03-30: 1 mg via ORAL
  Filled 2021-03-30: qty 1

## 2021-03-30 MED ORDER — LORAZEPAM 1 MG PO TABS: 1.0000 mg | ORAL_TABLET | Freq: Three times a day (TID) | ORAL | 0 refills | Status: AC | PRN

## 2021-03-30 NOTE — Discharge Instructions (Addendum)
Take Ativan as prescribed as needed for severe anxiety.  Follow-up with a primary doctor to discuss your medications.  Return to the ER if symptoms significantly worsen or change.

## 2021-03-30 NOTE — ED Provider Notes (Signed)
[facility] EMERGENCY DEPARTMENT Provider Note   CSN: [id number] Arrival date & time: 03/29/21  2113     History Chief Complaint  Patient presents with   Panic Attack    Wendy Waters is a 30 y.o. female.  Patient is a 30 year old female with history of hypertension and anxiety.  Patient presenting today with complaints of panic attack.  She was attempting to go to sleep when she began to feel very anxious and hyperventilating.  She has been having these episodes for several years, but this 1 was worse.  Patient called EMS and was transported here.  After a prolonged wait in the waiting room, her symptoms seem to be improving, but have not completely resolved.  She denies any chest pain.  She denies any drug or alcohol use.  The history is provided by the patient.      Past Medical History:  Diagnosis Date   Fracture of foot    right   Hypertension     There are no problems to display for this patient.   Past Surgical History:  Procedure Laterality Date   CESAREAN SECTION       OB History   No obstetric history on file.     No family history on file.  Social History   Tobacco Use   Smoking status: Every Day    Packs/day: 0.10    Types: Cigarettes   Smokeless tobacco: Never  Substance Use Topics   Alcohol use: Yes    Comment: occ   Drug use: No    Home Medications Prior to Admission medications   Medication Sig Start Date End Date Taking? Authorizing Provider  acetaminophen (TYLENOL) 650 MG CR tablet Take 650 mg by mouth every 8 (eight) hours as needed for pain.    [provider]  amoxicillin-clavulanate (AUGMENTIN) 875-125 MG tablet Take 1 tablet by mouth 2 (two) times daily. One po bid x 7 days Patient not taking: Reported on 10/20/2016 09/10/16   Street, [place], PA-C  ondansetron (ZOFRAN ODT) 4 MG disintegrating tablet Take 1 tablet (4 mg total) by mouth every 8 (eight) hours as needed for nausea or vomiting. Patient not  taking: Reported on 10/20/2016 09/10/16   Street, [place], PA-C    Allergies    Aleve [naproxen sodium]  Review of Systems   Review of Systems  All other systems reviewed and are negative.  Physical Exam Updated Vital Signs BP (!) 133/94 (BP Location: Right Arm)   Pulse 91   Temp 98.3 F (36.8 C) (Oral)   Resp 20   SpO2 99%   Physical Exam Vitals and nursing note reviewed.  Constitutional:      General: She is not in acute distress.    Appearance: She is well-developed. She is not diaphoretic.  HENT:     Head: Normocephalic and atraumatic.  Cardiovascular:     Rate and Rhythm: Normal rate and regular rhythm.     Heart sounds: No murmur heard.   No friction rub. No gallop.  Pulmonary:     Effort: Pulmonary effort is normal. No respiratory distress.     Breath sounds: Normal breath sounds. No wheezing.  Abdominal:     General: Bowel sounds are normal. There is no distension.     Palpations: Abdomen is soft.     Tenderness: There is no abdominal tenderness.  Musculoskeletal:        General: Normal range of motion.     Cervical back: Normal range of  motion and neck supple.  Skin:    General: Skin is warm and dry.  Neurological:     General: No focal deficit present.     Mental Status: She is alert and oriented to person, place, and time.    ED Results / Procedures / Treatments   Labs (all labs ordered are listed, but only abnormal results are displayed) Labs Reviewed  BASIC METABOLIC PANEL - Abnormal; Notable for the following components:      Result Value   Glucose, Bld 118 (*)    Creatinine, Ser 1.14 (*)    All other components within normal limits  CBC WITH DIFFERENTIAL/PLATELET - Abnormal; Notable for the following components:   RBC 5.19 (*)    All other components within normal limits  I-STAT BETA HCG BLOOD, ED ([facility], [facility], [facility] ONLY)    EKG None  Radiology No results found.  Procedures Procedures   Medications Ordered in ED Medications  LORazepam  (ATIVAN) tablet 1 mg (has no administration in time range)    ED Course  I have reviewed the triage vital signs and the nursing notes.  Pertinent labs & imaging results that were available during my care of the patient were reviewed by me and considered in my medical decision making (see chart for details).    MDM Rules/Calculators/A&P  Patient is a 30 year old female presenting with complaints of panic attack.  This began while she was attempting to sleep.  Patient began hyperventilating and could not calm herself down.  911 was called and she was transported here.  After prolonged wait in the waiting room, she is now feeling better.  Laboratory studies obtained are unremarkable.  Patient given p.o. Ativan and seems appropriate for discharge.  Final Clinical Impression(s) / ED Diagnoses Final diagnoses:  None    Rx / DC Orders ED Discharge Orders     None        Geoffery Lyons, MD 03/30/21 [id number]

## 2021-04-04 ENCOUNTER — Emergency Department (HOSPITAL_COMMUNITY)
Admission: EM | Admit: 2021-04-04 | Discharge: 2021-04-04 | Disposition: A | Discharge status: Home / Self Care | Payer: Medicaid Other | Attending: Emergency Medicine | Admitting: Emergency Medicine

## 2021-04-04 ENCOUNTER — Encounter (HOSPITAL_COMMUNITY): Payer: Self-pay | Admitting: Emergency Medicine

## 2021-04-04 DIAGNOSIS — Z5321 Procedure and treatment not carried out due to patient leaving prior to being seen by health care provider: Secondary | ICD-10-CM | POA: Insufficient documentation

## 2021-04-04 DIAGNOSIS — F419 Anxiety disorder, unspecified: Secondary | ICD-10-CM | POA: Diagnosis not present

## 2021-04-04 DIAGNOSIS — R519 Headache, unspecified: Secondary | ICD-10-CM | POA: Diagnosis not present

## 2021-04-04 DIAGNOSIS — R0981 Nasal congestion: Secondary | ICD-10-CM | POA: Insufficient documentation

## 2021-04-04 NOTE — ED Notes (Signed)
Pt states she is LWBS because this is taking to long. Tried to encourage her to stay but she said "No!"

## 2021-04-04 NOTE — ED Triage Notes (Signed)
Patient reports intermittent anxiety onset last week with nasal congestion and mild headache .

## 2022-11-09 ENCOUNTER — Emergency Department (HOSPITAL_COMMUNITY)
Admission: EM | Admit: 2022-11-09 | Discharge: 2022-11-10 | Disposition: A | Discharge status: Home / Self Care | Payer: Medicaid Other | Attending: Emergency Medicine | Admitting: Emergency Medicine

## 2022-11-09 ENCOUNTER — Other Ambulatory Visit: Payer: Self-pay

## 2022-11-09 ENCOUNTER — Encounter (HOSPITAL_COMMUNITY): Payer: Self-pay

## 2022-11-09 DIAGNOSIS — R3915 Urgency of urination: Secondary | ICD-10-CM

## 2022-11-09 DIAGNOSIS — R109 Unspecified abdominal pain: Secondary | ICD-10-CM | POA: Diagnosis not present

## 2022-11-09 DIAGNOSIS — R350 Frequency of micturition: Secondary | ICD-10-CM | POA: Diagnosis present

## 2022-11-09 DIAGNOSIS — N898 Other specified noninflammatory disorders of vagina: Secondary | ICD-10-CM | POA: Insufficient documentation

## 2022-11-09 DIAGNOSIS — B9689 Other specified bacterial agents as the cause of diseases classified elsewhere: Secondary | ICD-10-CM

## 2022-11-09 LAB — CBG MONITORING, ED: Glucose-Capillary: 164 mg/dL — ABNORMAL HIGH (ref 70–99)

## 2022-11-09 LAB — URINALYSIS, ROUTINE W REFLEX MICROSCOPIC
Bilirubin Urine: NEGATIVE
Glucose, UA: NEGATIVE mg/dL
Hgb urine dipstick: NEGATIVE
Ketones, ur: NEGATIVE mg/dL
Leukocytes,Ua: NEGATIVE
Nitrite: NEGATIVE
Protein, ur: 30 mg/dL — AB
Specific Gravity, Urine: 1.015 (ref 1.005–1.030)
pH: 7 (ref 5.0–8.0)

## 2022-11-09 LAB — PREGNANCY, URINE: Preg Test, Ur: NEGATIVE

## 2022-11-09 NOTE — ED Notes (Signed)
Pt has urine in triage

## 2022-11-09 NOTE — ED Provider Notes (Signed)
[facility] AT [facility] Provider Note   CSN: [id number] Arrival date & time: 11/09/22  1720     History  Chief Complaint  Patient presents with   Urinary Frequency    X 3/4 days ago   Flank Pain         Wendy Waters is a 32 y.o. female.  Patient reports three to four days of urinary frequency and mild, intermittent right flank discomfort. Flank pain improves with stretching. Patient is sexually active, one partner, no condom use. Patient denies vaginal discharge. Patient denies foul smelling urine or urine discoloration. Mild cramp-like discomfort overlying bladder intermittently. No history of diabetes.  The history is provided by the patient. No language interpreter was used.  Urinary Frequency The current episode started more than 2 days ago.  Flank Pain       Home Medications Prior to Admission medications   Medication Sig Start Date End Date Taking? Authorizing Provider  acetaminophen (TYLENOL) 650 MG CR tablet Take 650 mg by mouth every 8 (eight) hours as needed for pain.    [provider]  amoxicillin-clavulanate (AUGMENTIN) 875-125 MG tablet Take 1 tablet by mouth 2 (two) times daily. One po bid x 7 days Patient not taking: Reported on 10/20/2016 09/10/16   Street, [place], PA-C  LORazepam (ATIVAN) 1 MG tablet Take 1 tablet (1 mg total) by mouth 3 (three) times daily as needed for anxiety. 03/30/21   Geoffery Lyons, MD  ondansetron (ZOFRAN ODT) 4 MG disintegrating tablet Take 1 tablet (4 mg total) by mouth every 8 (eight) hours as needed for nausea or vomiting. Patient not taking: Reported on 10/20/2016 09/10/16   Street, [place], PA-C      Allergies    Aleve [naproxen sodium]    Review of Systems   Review of Systems  Genitourinary:  Positive for flank pain and frequency. Negative for vaginal discharge and vaginal pain.  All other systems reviewed and are negative.   Physical Exam Updated Vital Signs BP (!) 185/131    Pulse 88   Temp 98.8 F (37.1 C) (Oral)   Resp 18   Ht 5\' 2"  (1.575 m)   Wt 91.4 kg   SpO2 100%   BMI 36.84 kg/m  Physical Exam Vitals and nursing note reviewed.  Constitutional:      Appearance: Normal appearance.  HENT:     Head: Normocephalic.     Nose: Nose normal.     Mouth/Throat:     Mouth: Mucous membranes are moist.  Eyes:     Conjunctiva/sclera: Conjunctivae normal.  Cardiovascular:     Rate and Rhythm: Normal rate.  Pulmonary:     Effort: Pulmonary effort is normal.  Abdominal:     General: There is no distension.     Palpations: Abdomen is soft.     Tenderness: There is no abdominal tenderness. There is no guarding.  Musculoskeletal:        General: Normal range of motion.  Skin:    General: Skin is warm and dry.  Neurological:     Mental Status: She is alert and oriented to person, place, and time.  Psychiatric:        Mood and Affect: Mood normal.        Behavior: Behavior normal.     ED Results / Procedures / Treatments   Labs (all labs ordered are listed, but only abnormal results are displayed) Labs Reviewed  URINALYSIS, ROUTINE W REFLEX MICROSCOPIC  PREGNANCY, URINE  EKG None  Radiology No results found.  Procedures Procedures    Medications Ordered in ED Medications - No data to display  ED Course/ Medical Decision Making/ A&P                             Medical Decision Making Amount and/or Complexity of Data Reviewed Labs: ordered.   Labs pending. Patient signed out to R. Ransom PA for disposition.        Final Clinical Impression(s) / ED Diagnoses Final diagnoses:  None    Rx / DC Orders ED Discharge Orders     None         Felicie Morn, NP 11/09/22 2219    Lonell Grandchild, MD 11/10/22 [phone]

## 2022-11-09 NOTE — ED Triage Notes (Signed)
Patient arrived POV. Patient reports flank pain and frequent urination for few days. Denies dysuria

## 2022-11-10 LAB — CBC WITH DIFFERENTIAL/PLATELET
Abs Immature Granulocytes: 0.05 10*3/uL (ref 0.00–0.07)
Basophils Absolute: 0.1 10*3/uL (ref 0.0–0.1)
Basophils Relative: 1 %
Eosinophils Absolute: 0.1 10*3/uL (ref 0.0–0.5)
Eosinophils Relative: 1 %
HCT: 42.8 % (ref 36.0–46.0)
Hemoglobin: 13.9 g/dL (ref 12.0–15.0)
Immature Granulocytes: 1 %
Lymphocytes Relative: 33 %
Lymphs Abs: 3.4 10*3/uL (ref 0.7–4.0)
MCH: 26.5 pg (ref 26.0–34.0)
MCHC: 32.5 g/dL (ref 30.0–36.0)
MCV: 81.5 fL (ref 80.0–100.0)
Monocytes Absolute: 0.8 10*3/uL (ref 0.1–1.0)
Monocytes Relative: 7 %
Neutro Abs: 6.1 10*3/uL (ref 1.7–7.7)
Neutrophils Relative %: 57 %
Platelets: 330 10*3/uL (ref 150–400)
RBC: 5.25 MIL/uL — ABNORMAL HIGH (ref 3.87–5.11)
RDW: 14.6 % (ref 11.5–15.5)
WBC: 10.5 10*3/uL (ref 4.0–10.5)
nRBC: 0 % (ref 0.0–0.2)

## 2022-11-10 LAB — BASIC METABOLIC PANEL
Anion gap: 9 (ref 5–15)
BUN: 16 mg/dL (ref 6–20)
CO2: 26 mmol/L (ref 22–32)
Calcium: 9.3 mg/dL (ref 8.9–10.3)
Chloride: 103 mmol/L (ref 98–111)
Creatinine, Ser: 1.07 mg/dL — ABNORMAL HIGH (ref 0.44–1.00)
GFR, Estimated: >60 mL/min (ref >60)
Glucose, Bld: 137 mg/dL — ABNORMAL HIGH (ref 70–99)
Potassium: 3.3 mmol/L — ABNORMAL LOW (ref 3.5–5.1)
Sodium: 138 mmol/L (ref 135–145)

## 2022-11-10 LAB — WET PREP, GENITAL
Sperm: NONE SEEN
Trich, Wet Prep: NONE SEEN
WBC, Wet Prep HPF POC: <10 (ref <10)
Yeast Wet Prep HPF POC: NONE SEEN

## 2022-11-10 LAB — GC/CHLAMYDIA PROBE AMP (~~LOC~~) NOT AT ARMC
Chlamydia: NEGATIVE
Comment: NEGATIVE
Comment: NORMAL
Neisseria Gonorrhea: NEGATIVE

## 2022-11-10 MED ORDER — POTASSIUM CHLORIDE CRYS ER 20 MEQ PO TBCR
20.0000 meq | EXTENDED_RELEASE_TABLET | Freq: Once | ORAL | Status: AC
  Administered 2022-11-10: 20 meq via ORAL
  Filled 2022-11-10: qty 1

## 2022-11-10 MED ORDER — METRONIDAZOLE 500 MG PO TABS: 500.0000 mg | ORAL_TABLET | Freq: Two times a day (BID) | ORAL | 0 refills | Status: AC

## 2022-11-10 NOTE — ED Provider Notes (Signed)
Physical Exam  BP (!) 147/99 (BP Location: Right Arm)   Pulse 98   Temp 98.8 F (37.1 C) (Oral)   Resp 18   Ht 5\' 2"  (1.575 m)   Wt 91.4 kg   SpO2 97%   BMI 36.84 kg/m   Physical Exam Vitals and nursing note reviewed. Exam conducted with a chaperone present Morrie Sheldon, [place]).  Constitutional:      General: She is not in acute distress.    Appearance: Normal appearance. She is not ill-appearing or toxic-appearing.  Eyes:     General: No scleral icterus. Cardiovascular:     Rate and Rhythm: Normal rate.  Pulmonary:     Effort: Pulmonary effort is normal. No respiratory distress.  Abdominal:     Palpations: Abdomen is soft.     Tenderness: There is no abdominal tenderness. There is no right CVA tenderness, left CVA tenderness, guarding or rebound.  Genitourinary:    Cervix: No cervical motion tenderness.     Comments: Small amount of thin white vaginal discharge noted. No lesions appreciated. No CMT tenderness.  Skin:    General: Skin is dry.  Neurological:     General: No focal deficit present.     Mental Status: She is alert. Mental status is at baseline.     Gait: Gait normal.  Psychiatric:        Mood and Affect: Mood normal.     Procedures  Procedures  ED Course / MDM    Medical Decision Making Amount and/or Complexity of Data Reviewed Labs: ordered.  Risk Prescription drug management.   Accepted handoff at shift change from Felicie Morn NP. Please see prior provider note for more detail.   Briefly: Patient is 32 y.o. F presenting for urinary frequency over the past few days.  She reports that she is not having any pain with urination however she does have a urgency frequency.  She reports that she is not having any large-volume of urination and sometimes it is just a few drops.  She also is reporting some pelvic pressure as well.  Denies any abnormal vaginal discharge or vaginal bleeding.  She reports that she is sexually active with 1 partner and would like  testing for STDs as well.  She reports that she has some very low back pain but this has been "for forever".  Not worsening.  Improves with stretching.  DDX: concern for UTI, interstitial situs, PID  Plan: Follow-up with labs.  I independently reviewed and interpreted the patient's labs.  CBC does show elevation at 164 although patient is actively drinking a soda.  BMP shows potassium 313 with glucose of 137.  Creatinine at 1.07.  Does appear to be around her baseline.  CBC does not show any leukocytosis or anemia.  Urinalysis shows 30 protein with few bacteria however there is no red blood cells, white blood cells, nitrates, leukocytes present.  Will culture given her urinary urgency frequency.  Pregnancy test negative.  Wet prep shows some clue cells.   Given her urinary urgency and frequency, I did order a urine culture that is pending for any occult infection.  GC collected and pending as well.  She would be having symptoms given the bacterial vaginosis but cannot rule out any occult infection.  Urinalysis is not suggestive of any infection other than a few bacteria but no other leukocytes, white blood cells, or nitrates present.  Question possible kidney stone causes however she does not have any belly pain upon palpation nor  any CVA tenderness upon percussion.  No hematuria present as well.  Will give her follow-up with urology for urgency and frequency.  Will give her follow-up with primary care for her elevated glucose.  Additionally, patient was asking about fertility questions as she is trying to conceive.  [facility] and [facility].  The patient was given Flagyl for bacterial vaginosis.  We discussed the results of the labs/imaging. The plan is follow up with gynecology, urology, and primary care.  Take Flagyl as prescribed. We discussed strict return precautions and red flag symptoms. The patient verbalized their understanding and agrees to  the plan. The patient is stable and being discharged home in good condition.  Portions of this report may have been transcribed using voice recognition software. Every effort was made to ensure accuracy; however, inadvertent computerized transcription errors may be present.    Results for orders placed or performed during the hospital encounter of 11/09/22  Wet prep, genital  Result Value Ref Range   Yeast Wet Prep HPF POC NONE SEEN NONE SEEN   Trich, Wet Prep NONE SEEN NONE SEEN   Clue Cells Wet Prep HPF POC PRESENT (A) NONE SEEN   WBC, Wet Prep HPF POC <10 <10   Sperm NONE SEEN   Urinalysis, Routine w reflex microscopic -Urine, Clean Catch  Result Value Ref Range   Color, Urine YELLOW YELLOW   APPearance CLEAR CLEAR   Specific Gravity, Urine 1.015 1.005 - 1.030   pH 7.0 5.0 - 8.0   Glucose, UA NEGATIVE NEGATIVE mg/dL   Hgb urine dipstick NEGATIVE NEGATIVE   Bilirubin Urine NEGATIVE NEGATIVE   Ketones, ur NEGATIVE NEGATIVE mg/dL   Protein, ur 30 (A) NEGATIVE mg/dL   Nitrite NEGATIVE NEGATIVE   Leukocytes,Ua NEGATIVE NEGATIVE   RBC / HPF 0-5 0 - 5 RBC/hpf   WBC, UA 0-5 0 - 5 WBC/hpf   Bacteria, UA FEW (A) NONE SEEN   Squamous Epithelial / HPF 0-5 0 - 5 /HPF   Mucus PRESENT   Pregnancy, urine  Result Value Ref Range   Preg Test, Ur NEGATIVE NEGATIVE  CBC with Differential  Result Value Ref Range   WBC 10.5 4.0 - 10.5 K/uL   RBC 5.25 (H) 3.87 - 5.11 MIL/uL   Hemoglobin 13.9 12.0 - 15.0 g/dL   HCT 29.5 62.1 - 30.8 %   MCV 81.5 80.0 - 100.0 fL   MCH 26.5 26.0 - 34.0 pg   MCHC 32.5 30.0 - 36.0 g/dL   RDW 65.7 84.6 - 96.2 %   Platelets 330 150 - 400 K/uL   nRBC 0.0 0.0 - 0.2 %   Neutrophils Relative % 57 %   Neutro Abs 6.1 1.7 - 7.7 K/uL   Lymphocytes Relative 33 %   Lymphs Abs 3.4 0.7 - 4.0 K/uL   Monocytes Relative 7 %   Monocytes Absolute 0.8 0.1 - 1.0 K/uL   Eosinophils Relative 1 %   Eosinophils Absolute 0.1 0.0 - 0.5 K/uL   Basophils Relative 1 %   Basophils  Absolute 0.1 0.0 - 0.1 K/uL   Immature Granulocytes 1 %   Abs Immature Granulocytes 0.05 0.00 - 0.07 K/uL  Basic metabolic panel  Result Value Ref Range   Sodium 138 135 - 145 mmol/L   Potassium 3.3 (L) 3.5 - 5.1 mmol/L   Chloride 103 98 - 111 mmol/L   CO2 26 22 - 32 mmol/L   Glucose, Bld 137 (H) 70 -  99 mg/dL   BUN 16 6 - 20 mg/dL   Creatinine, Ser 1.61 (H) 0.44 - 1.00 mg/dL   Calcium 9.3 8.9 - 09.6 mg/dL   GFR, Estimated >04 >54 mL/min   Anion gap 9 5 - 15  CBG monitoring, ED  Result Value Ref Range   Glucose-Capillary 164 (H) 70 - 99 mg/dL   No results found.       Achille Rich, PA-C 11/10/22 0981    Arby Barrette, MD 11/10/22 [phone]

## 2022-11-10 NOTE — Discharge Instructions (Addendum)
You were seen in the emergency room today for evaluation of your urinary urgency and frequency.  Your urine does not show any signs of overt infection however I have added a urine culture.  If it does show some signs of infection, it will grow out bacteria.  Please make sure you follow-up with your MyChart for results however someone will call you with antibiotics and if it is positive.  I given you follow-up with [facility] for follow-up with this issue.  Please make sure you call them to schedule an appointment.  For your pelvic discomfort as well as for your fertility questions, I have listed information for the [facility] wellness center.  Please make sure you call them to schedule an appointment as well.  For your primary care needs, your blood glucose was a little elevated.  I would like you to follow-up for further testing for possible diabetes or prediabetes.  Your swab today shows and then called bacterial vaginosis which is a overgrowth of bad bacteria in the vagina.  For this, you will need placed on a medication called Flagyl.  You will take twice daily for neck 7 days.  Do not consume any alcohol while on this medication as it will make you extremely ill.  Please call the [facility] for an appointment.  If you have any concerns, new or worsening symptoms, please return to the nearest emergency department for evaluation.  Contact a health care provider if: You start urinating more often. You feel pain or irritation when you urinate. You notice blood in your urine. Your urine looks cloudy. You develop a fever. You begin vomiting. Get help right away if: You are unable to urinate.

## 2022-11-11 LAB — URINE CULTURE

## 2023-05-23 ENCOUNTER — Ambulatory Visit (HOSPITAL_COMMUNITY)
Admission: EM | Admit: 2023-05-23 | Discharge: 2023-05-23 | Disposition: A | Discharge status: Home / Self Care | Payer: No Typology Code available for payment source | Source: Ambulatory Visit | Attending: Emergency Medicine | Admitting: Emergency Medicine

## 2023-05-23 ENCOUNTER — Emergency Department (HOSPITAL_COMMUNITY): Payer: MEDICAID

## 2023-05-23 ENCOUNTER — Other Ambulatory Visit: Payer: Self-pay

## 2023-05-23 ENCOUNTER — Emergency Department (HOSPITAL_COMMUNITY)
Admission: EM | Admit: 2023-05-23 | Discharge: 2023-05-23 | Disposition: A | Discharge status: Home / Self Care | Payer: MEDICAID | Attending: Emergency Medicine | Admitting: Emergency Medicine

## 2023-05-23 ENCOUNTER — Encounter (HOSPITAL_COMMUNITY): Payer: Self-pay

## 2023-05-23 DIAGNOSIS — R000 Tachycardia, unspecified: Secondary | ICD-10-CM | POA: Insufficient documentation

## 2023-05-23 DIAGNOSIS — Z0441 Encounter for examination and observation following alleged adult rape: Secondary | ICD-10-CM | POA: Insufficient documentation

## 2023-05-23 DIAGNOSIS — I10 Essential (primary) hypertension: Secondary | ICD-10-CM | POA: Insufficient documentation

## 2023-05-23 DIAGNOSIS — E119 Type 2 diabetes mellitus without complications: Secondary | ICD-10-CM | POA: Diagnosis not present

## 2023-05-23 DIAGNOSIS — R944 Abnormal results of kidney function studies: Secondary | ICD-10-CM | POA: Diagnosis not present

## 2023-05-23 DIAGNOSIS — F17210 Nicotine dependence, cigarettes, uncomplicated: Secondary | ICD-10-CM | POA: Insufficient documentation

## 2023-05-23 DIAGNOSIS — Z202 Contact with and (suspected) exposure to infections with a predominantly sexual mode of transmission: Secondary | ICD-10-CM

## 2023-05-23 DIAGNOSIS — T71193A Asphyxiation due to mechanical threat to breathing due to other causes, assault, initial encounter: Secondary | ICD-10-CM

## 2023-05-23 DIAGNOSIS — S0081XA Abrasion of other part of head, initial encounter: Secondary | ICD-10-CM | POA: Diagnosis not present

## 2023-05-23 DIAGNOSIS — S0990XA Unspecified injury of head, initial encounter: Secondary | ICD-10-CM | POA: Diagnosis present

## 2023-05-23 HISTORY — DX: Type 2 diabetes mellitus without complications: E11.9

## 2023-05-23 LAB — I-STAT CHEM 8, ED
BUN: 17 mg/dL (ref 6–20)
Calcium, Ion: 1.05 mmol/L — ABNORMAL LOW (ref 1.15–1.40)
Chloride: 108 mmol/L (ref 98–111)
Creatinine, Ser: 1.1 mg/dL — ABNORMAL HIGH (ref 0.44–1.00)
Glucose, Bld: 117 mg/dL — ABNORMAL HIGH (ref 70–99)
HCT: 37 % (ref 36.0–46.0)
Hemoglobin: 12.6 g/dL (ref 12.0–15.0)
Potassium: 6.3 mmol/L (ref 3.5–5.1)
Sodium: 140 mmol/L (ref 135–145)
TCO2: 25 mmol/L (ref 22–32)

## 2023-05-23 LAB — CBC WITH DIFFERENTIAL/PLATELET
Abs Immature Granulocytes: 0.03 10*3/uL (ref 0.00–0.07)
Basophils Absolute: 0 10*3/uL (ref 0.0–0.1)
Basophils Relative: 0 %
Eosinophils Absolute: 0 10*3/uL (ref 0.0–0.5)
Eosinophils Relative: 0 %
HCT: 41.1 % (ref 36.0–46.0)
Hemoglobin: 13.7 g/dL (ref 12.0–15.0)
Immature Granulocytes: 0 %
Lymphocytes Relative: 18 %
Lymphs Abs: 1.8 10*3/uL (ref 0.7–4.0)
MCH: 26.3 pg (ref 26.0–34.0)
MCHC: 33.3 g/dL (ref 30.0–36.0)
MCV: 79 fL — ABNORMAL LOW (ref 80.0–100.0)
Monocytes Absolute: 0.6 10*3/uL (ref 0.1–1.0)
Monocytes Relative: 6 %
Neutro Abs: 7.8 10*3/uL — ABNORMAL HIGH (ref 1.7–7.7)
Neutrophils Relative %: 76 %
Platelets: 375 10*3/uL (ref 150–400)
RBC: 5.2 MIL/uL — ABNORMAL HIGH (ref 3.87–5.11)
RDW: 15.4 % (ref 11.5–15.5)
WBC: 10.3 10*3/uL (ref 4.0–10.5)
nRBC: 0 % (ref 0.0–0.2)

## 2023-05-23 LAB — BASIC METABOLIC PANEL
Anion gap: 11 (ref 5–15)
BUN: 12 mg/dL (ref 6–20)
CO2: 24 mmol/L (ref 22–32)
Calcium: 9.6 mg/dL (ref 8.9–10.3)
Chloride: 108 mmol/L (ref 98–111)
Creatinine, Ser: 1.15 mg/dL — ABNORMAL HIGH (ref 0.44–1.00)
GFR, Estimated: >60 mL/min (ref >60)
Glucose, Bld: 100 mg/dL — ABNORMAL HIGH (ref 70–99)
Potassium: 4.1 mmol/L (ref 3.5–5.1)
Sodium: 143 mmol/L (ref 135–145)

## 2023-05-23 LAB — CBG MONITORING, ED: Glucose-Capillary: 96 mg/dL (ref 70–99)

## 2023-05-23 LAB — HCG, QUANTITATIVE, PREGNANCY: hCG, Beta Chain, Quant, S: <1 m[IU]/mL (ref <5)

## 2023-05-23 MED ORDER — ACETAMINOPHEN 325 MG PO TABS: 650.0000 mg | ORAL_TABLET | Freq: Four times a day (QID) | ORAL | 0 refills | Status: AC | PRN

## 2023-05-23 MED ORDER — AZITHROMYCIN 250 MG PO TABS
1000.0000 mg | ORAL_TABLET | Freq: Once | ORAL | Status: AC
  Administered 2023-05-23: 1000 mg via ORAL

## 2023-05-23 MED ORDER — ACETAMINOPHEN 500 MG PO TABS
1000.0000 mg | ORAL_TABLET | Freq: Once | ORAL | Status: AC
  Administered 2023-05-23: 1000 mg via ORAL
  Filled 2023-05-23: qty 2

## 2023-05-23 MED ORDER — IOHEXOL 350 MG/ML SOLN
75.0000 mL | Freq: Once | INTRAVENOUS | Status: AC | PRN
  Administered 2023-05-23: 75 mL via INTRAVENOUS

## 2023-05-23 MED ORDER — LIDOCAINE 5 % EX PTCH: 1.0000 | MEDICATED_PATCH | Freq: Every day | CUTANEOUS | 0 refills | Status: AC | PRN

## 2023-05-23 MED ORDER — LIDOCAINE HCL (PF) 1 % IJ SOLN
1.0000 mL | Freq: Once | INTRAMUSCULAR | Status: AC
Start: 2023-05-23 — End: 2023-05-23
  Administered 2023-05-23: 2 mL

## 2023-05-23 MED ORDER — CYCLOBENZAPRINE HCL 10 MG PO TABS: 10.0000 mg | ORAL_TABLET | Freq: Two times a day (BID) | ORAL | 0 refills | Status: AC | PRN

## 2023-05-23 MED ORDER — ULIPRISTAL ACETATE 30 MG PO TABS
30.0000 mg | ORAL_TABLET | Freq: Once | ORAL | Status: AC
  Administered 2023-05-23: 30 mg via ORAL

## 2023-05-23 MED ORDER — CEFTRIAXONE SODIUM 500 MG IJ SOLR
500.0000 mg | Freq: Once | INTRAMUSCULAR | Status: AC
  Administered 2023-05-23: 500 mg via INTRAMUSCULAR

## 2023-05-23 MED ORDER — METRONIDAZOLE 500 MG PO TABS
2000.0000 mg | ORAL_TABLET | Freq: Once | ORAL | Status: AC
Start: 2023-05-23 — End: 2023-05-23
  Administered 2023-05-23: 2000 mg via ORAL

## 2023-05-23 NOTE — Discharge Instructions (Addendum)
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 pharmacy or law enforcement to find a location. If you cannot return the medication, ask your pharmacist or care team how to get rid of this medication safely.  NOTE: This sheet is a summary. It may not cover all possible information. If you have questions about this medicine, talk to your doctor, pharmacist, or health care provider.  2024 Elsevier/Gold Standard (2021-08-03 00:00:00)  Ulipristal Tablets  What is this medication? ULIPRISTAL (UE li pris tal) can prevent pregnancy. It should be taken as soon as possible in the 5 days (120 hours) after unprotected sex or if you think your contraceptive didn't work. It belongs to a group of medications called emergency contraceptives. It does not prevent HIV or other sexually transmitted infections (STIs). This medicine may be used for other purposes; ask your health care provider or pharmacist if you have questions. COMMON BRAND NAME(S): ella  What should I  tell my care team before I take this medication? They need to know if you have any of these conditions: Liver disease An unusual or allergic reaction to ulipristal, other medications, foods, dyes, or preservatives Pregnant or trying to get pregnant Breastfeeding  How should I use this medication? Take this medication by mouth with or without food. Your care team may want you to use a quick-response pregnancy test prior to using the tablets. Take your medication as soon as possible and not more than 5 days (120 hours) after the event. This medication can be taken at any time during your menstrual cycle. Follow the dose instructions of your care team exactly. Contact your care team right away if you vomit within 3 hours of taking your medication to discuss if you need to take another tablet. A patient package insert for the product will be given with each prescription and refill. Be sure to read this information carefully each time. The sheet may change often. Contact your care team about the use of this medication in children. Special care may be needed. Overdosage: If you think you have taken too much of this medicine contact a poison control center or emergency room at once. NOTE: This medicine is only for you. Do not share this medicine with others.  What if I miss a dose? This medication is not for regular use. If you vomit within 3 hours of taking your dose, contact your care team for instructions.  What may interact with this medication? This medication may interact with the following: Barbiturates, such as phenobarbital or primidone Bosentan Carbamazepine Certain antivirals for HIV or hepatitis Certain medications for fungal infections, such as griseofulvin, itraconazole, ketoconazole Dabigatran Digoxin Estrogen or progestin hormones Felbamate Fexofenadine Oxcarbazepine Phenytoin Rifampin St. John's wort Topiramate This list may not describe all possible interactions. Give  your health care provider a list of all the medicines, herbs, non-prescription drugs, or dietary supplements you use. Also tell them if you smoke, drink alcohol, or use illegal drugs. Some items may interact with your medicine.  What should I watch for while using this medication? Your period may begin a few days earlier or later than expected. If your period is more than 7 days late, pregnancy is possible. See your care team as soon as you can and get a pregnancy test. Talk to your care team before taking this medication if you know or suspect that you are pregnant. Contact your care team if you think you may be pregnant and have taken this medication. If you have severe abdominal pain about 3 to 5 weeks after taking this medication you may have a pregnancy outside the womb, which is called an ectopic or tubal pregnancy. Call your care team or go to the nearest emergency room right away if you think this is happening. Talk to your care team about reliable forms of contraception. Emergency contraception is not to be used routinely to prevent pregnancy. It  should not be used more than once in the same menstrual cycle. Estrogen and progestin hormones may not work as well while you are taking this medication. Wait at least 5 days after taking this medication to start or continue estrogen or progestin contraceptive medications. Also, a barrier contraceptive, such as a condom or diaphragm, is recommended between the time you take this medication and until your next menstrual period.  What side effects may I notice from receiving this medication? Side effects that you should report to your care team as soon as possible: Allergic reactions--skin rash, itching, hives, swelling of the face, lips, tongue, or throat Side effects that usually do not require medical attention (report to your care team if they continue or are bothersome): Dizziness Fatigue Headache Irregular menstrual cycles or spotting Menstrual  cramps Nausea Stomach pain  This list may not describe all possible side effects. Call your doctor for medical advice about side effects. You may report side effects to FDA at 1-800-FDA-1088.  Where should I keep my medication? Keep out of the reach of children and pets. Store at room temperature between 20 and 25 degrees C (68 and 77 degrees F). Protect from light. Keep in the blister card inside the original box until you are ready to take it. Get rid of any unused medication after the expiration date. To get rid of medications that are no longer needed or have expired: Take the medication to a medication take-back program. Check with your pharmacy or law enforcement to find a location. If you cannot return the medication, ask your pharmacist or care team how to get rid of this medication safely.  NOTE: This sheet is a summary. It may not cover all possible information. If you have questions about this medicine, talk to your doctor, pharmacist, or health care provider.  2024 Elsevier/Gold Standard (2021-12-10 00:00:00)    Based on the events which brought you to the ER today, it is possible that you may have a concussion. A concussion occurs when there is a blow to the head or body, with enough force to shake the brain and disrupt how the brain functions. You may experience symptoms such as headaches, sensitivity to light/noise, dizziness, cognitive slowing, difficulty concentrating / remembering, trouble sleeping and drowsiness. These symptoms may last anywhere from hours/days to potentially weeks/months. While these symptoms are very frustrating and perhaps debilitating, it is important that you remember that they will improve over time. Everyone has a different rate of recovery; it is difficult to predict when your symptoms will resolve. In order to allow for your brain to heal after the injury, we recommend that you see your primary physician or a physician knowledgeable in concussion  management. We also advise you to let your body and brain rest: avoid physical activities (sports, gym, and exercise) and reduce cognitive demands (reading, texting, TV watching, computer use, video games, etc). School attendance, after-school activities and work may need to be modified to avoid increasing symptoms. We recommend against driving until until all symptoms have resolved. Come back to the ER right away if you are having repeated episodes of vomiting, severe/worsening headache/dizziness or any other symptom that alarms you. We recommended that someone stay with you for the next 24 hours to monitor for these worrisome symptoms.   Please avoid sexual intercourse until completion of antibiotics  It was a pleasure caring for you today in the emergency department.  Please return to the emergency department for any worsening or worrisome symptoms.
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
## 2023-05-23 NOTE — ED Notes (Signed)
Pt needed to go to the bathroom pt stood up to ambulate to the bathroom and pt became tremulous . Blood sugar obtained at that time found to be WDL. Pt given a drink and felt much better. Pt able to ambulate to the bathroom at that time with a steady gait.
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
## 2023-05-24 NOTE — Consult Note (Signed)
The SANE/FNE (Forensic Nurse Examiner) consult has been completed. The primary RN and provider have been notified. Please contact the SANE/FNE nurse on call (listed in Amion) with any further concerns.
